# Patient Record
Sex: Male | Born: 1970 | Race: White | Hispanic: No | Marital: Married | Smoking: Former smoker
Health system: Southern US, Community
[De-identification: ages and names within clinical notes are randomized; demographics above are authoritative.]

## PROBLEM LIST (undated history)

## (undated) DIAGNOSIS — I10 Essential (primary) hypertension: Secondary | ICD-10-CM

## (undated) DIAGNOSIS — G4733 Obstructive sleep apnea (adult) (pediatric): Principal | ICD-10-CM

## (undated) DIAGNOSIS — R011 Cardiac murmur, unspecified: Secondary | ICD-10-CM

## (undated) DIAGNOSIS — N486 Induration penis plastica: Secondary | ICD-10-CM

## (undated) DIAGNOSIS — M1990 Unspecified osteoarthritis, unspecified site: Secondary | ICD-10-CM

## (undated) DIAGNOSIS — G4730 Sleep apnea, unspecified: Secondary | ICD-10-CM

## (undated) DIAGNOSIS — J302 Other seasonal allergic rhinitis: Secondary | ICD-10-CM

## (undated) DIAGNOSIS — R0902 Hypoxemia: Secondary | ICD-10-CM

## (undated) HISTORY — DX: Induration penis plastica: N48.6

## (undated) HISTORY — DX: Obstructive sleep apnea (adult) (pediatric): G47.33

## (undated) HISTORY — DX: Cardiac murmur, unspecified: R01.1

## (undated) HISTORY — DX: Unspecified osteoarthritis, unspecified site: M19.90

## (undated) HISTORY — DX: Hypoxemia: R09.02

## (undated) HISTORY — DX: Other seasonal allergic rhinitis: J30.2

## (undated) HISTORY — PX: MULTIPLE TOOTH EXTRACTIONS: SHX2053

## (undated) HISTORY — DX: Sleep apnea, unspecified: G47.30

---

## 2000-08-08 ENCOUNTER — Emergency Department (HOSPITAL_COMMUNITY): Admission: EM | Admit: 2000-08-08 | Discharge: 2000-08-08 | Payer: Self-pay | Admitting: Emergency Medicine

## 2000-08-08 ENCOUNTER — Encounter: Payer: Self-pay | Admitting: Emergency Medicine

## 2000-08-14 ENCOUNTER — Ambulatory Visit (HOSPITAL_COMMUNITY): Admission: RE | Admit: 2000-08-14 | Discharge: 2000-08-14 | Payer: Self-pay | Admitting: Family Medicine

## 2000-08-14 ENCOUNTER — Encounter: Payer: Self-pay | Admitting: Family Medicine

## 2001-05-20 ENCOUNTER — Emergency Department (HOSPITAL_COMMUNITY): Admission: EM | Admit: 2001-05-20 | Discharge: 2001-05-21 | Payer: Self-pay | Admitting: *Deleted

## 2005-06-10 ENCOUNTER — Emergency Department (HOSPITAL_COMMUNITY): Admission: EM | Admit: 2005-06-10 | Discharge: 2005-06-10 | Payer: Self-pay | Admitting: Emergency Medicine

## 2008-05-26 ENCOUNTER — Encounter: Admission: RE | Admit: 2008-05-26 | Discharge: 2008-05-26 | Payer: Self-pay | Admitting: Otolaryngology

## 2013-06-08 ENCOUNTER — Emergency Department (HOSPITAL_COMMUNITY)
Admission: EM | Admit: 2013-06-08 | Discharge: 2013-06-08 | Disposition: A | Discharge status: Home / Self Care | Payer: Managed Care, Other (non HMO) | Source: Home / Self Care

## 2013-06-08 ENCOUNTER — Encounter (HOSPITAL_COMMUNITY): Payer: Self-pay | Admitting: Emergency Medicine

## 2013-06-08 DIAGNOSIS — M7041 Prepatellar bursitis, right knee: Secondary | ICD-10-CM

## 2013-06-08 DIAGNOSIS — M7040 Prepatellar bursitis, unspecified knee: Secondary | ICD-10-CM

## 2013-06-08 HISTORY — DX: Essential (primary) hypertension: I10

## 2013-06-08 MED ORDER — CEPHALEXIN 500 MG PO CAPS: 1000.0000 mg | ORAL_CAPSULE | Freq: Three times a day (TID) | ORAL | Status: DC

## 2013-06-08 MED ORDER — IBUPROFEN 800 MG PO TABS: 800.0000 mg | ORAL_TABLET | Freq: Three times a day (TID) | ORAL | Status: DC

## 2013-06-08 NOTE — ED Notes (Signed)
Pt C/o Rt knee injury that happened when he fell in his attic 5 days ago.  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  Pt states that is knee is red, swollen and hot to the touch.  Pt states that they drew a ring around his knee to see how redness and swelling were progressing, Pt states that the redness has spread down his leg.  Pt states he did not see any puncture wounds.  Pt denies any fever or N/V. Pt is alert and oriented and in no acute distress.  Leilani Able CMA Student

## 2013-06-08 NOTE — ED Provider Notes (Signed)
History    CSN: [id number] Arrival date & time 06/08/13  1501  None    Chief Complaint  Patient presents with  . Knee Injury  . Insect Bite   (Consider location/radiation/quality/duration/timing/severity/associated sxs/prior Treatment) HPI Comments: 42 year old male presents for evaluation of pain, swelling, and redness in the right knee. This began 5 days ago after falling forward onto his knees in his attic. Since then, the right knee has become red and swollen and has it coming off of it. It was  sore but the soreness has started to resolve. The redness and swelling have begun to resolve on the as well. He has been taking ibuprofen as needed, mainly at night. He has increased pain with bending the knee up well past 90 but otherwise there is no pain with activities. He denies fever, chills, swelling in any other joints, or feeling sick at all.  Past Medical History  Diagnosis Date  . Hypertension    History reviewed. No pertinent past surgical history. No family history on file. History  Substance Use Topics  . Smoking status: Never Smoker   . Smokeless tobacco: Not on file  . Alcohol Use: No    Review of Systems  Constitutional: Negative for fever, chills and fatigue.  HENT: Negative for sore throat, neck pain and neck stiffness.   Eyes: Negative for visual disturbance.  Respiratory: Negative for cough and shortness of breath.   Cardiovascular: Negative for chest pain, palpitations and leg swelling.  Gastrointestinal: Negative for nausea, vomiting, abdominal pain, diarrhea and constipation.  Genitourinary: Negative for dysuria, urgency, frequency and hematuria.  Musculoskeletal: Positive for joint swelling and arthralgias. Negative for myalgias.  Skin: Negative for rash.  Neurological: Negative for dizziness, weakness and light-headedness.    Allergies  Review of patient's allergies indicates no known allergies.  Home Medications   Current Outpatient Rx  Name   Route  Sig  Dispense  Refill  . cephALEXin (KEFLEX) 500 MG capsule   Oral   Take 2 capsules (1,000 mg total) by mouth 3 (three) times daily.   42 capsule   0   . ibuprofen (ADVIL,MOTRIN) 800 MG tablet   Oral   Take 1 tablet (800 mg total) by mouth 3 (three) times daily.   90 tablet   0    BP 153/102  Pulse 91  Temp(Src) 98.6 F (37 C)  Resp 20  SpO2 100% Physical Exam  Nursing note and vitals reviewed. Constitutional: He is oriented to person, place, and time. He appears well-developed and well-nourished. No distress.  HENT:  Head: Normocephalic and atraumatic.  Eyes: EOM are normal. Pupils are equal, round, and reactive to light.  Cardiovascular: Normal rate and regular rhythm.  Exam reveals no gallop and no friction rub.   No murmur heard. Pulmonary/Chest: Effort normal and breath sounds normal. No respiratory distress. He has no wheezes. He has no rales.  Abdominal: Soft. There is no tenderness.  Musculoskeletal:       Right knee: He exhibits swelling (Over the inferior patella), effusion (Minimal  effusion) and erythema (Over the inferior anterior and lateral aspects of the knee). He exhibits normal range of motion and normal patellar mobility. Tenderness (over the inferior patellaand just lateral and medial to this) found.  Neurological: He is oriented to person, place, and time.  Skin: Skin is warm and dry. No rash noted.  Psychiatric: He has a normal mood and affect. Judgment normal.    ED Course  Procedures (including critical care time)  Labs Reviewed - No data to display No results found. 1. Prepatellar bursitis, right     MDM  This patient has a prepatellar bursitis. There is low suspicion for a septic bursitis but not a septic joint at this time. He has some pain with extreme bending of the knee but no pain in the knee joint itself and no pain with ambulation. We'll treat with anti-inflammatories, also will cover with Keflex in case there is septic bursitis or  some cellulitis over the knee. Discussed with patient the possibility of developing a septic arthritis if this is in fact cellulitis in the skin overlying the joint. He understands and if the pain/swelling gets worse or he starts to feel sick he will return immediately. For the same concern, I do not want to aspirate the knee joint at this time and potentially introduce infection into the joint   Meds ordered this encounter  Medications  . ibuprofen (ADVIL,MOTRIN) 800 MG tablet    Sig: Take 1 tablet (800 mg total) by mouth 3 (three) times daily.    Dispense:  90 tablet    Refill:  0  . cephALEXin (KEFLEX) 500 MG capsule    Sig: Take 2 capsules (1,000 mg total) by mouth 3 (three) times daily.    Dispense:  42 capsule    Refill:  0     Graylon Good, PA-C 06/08/13 1605

## 2013-06-10 NOTE — ED Provider Notes (Signed)
Medical screening examination/treatment/procedure(s) were performed by a resident physician or non-physician practitioner and as the supervising physician I was immediately available for consultation/collaboration.  Lenaya Pietsch, MD    Phyllis Whitefield S Constantina Laseter, MD 06/10/13 1719 

## 2014-05-20 ENCOUNTER — Encounter: Payer: Self-pay | Admitting: *Deleted

## 2017-01-16 ENCOUNTER — Ambulatory Visit: Payer: Managed Care, Other (non HMO) | Admitting: Primary Care

## 2017-01-21 ENCOUNTER — Ambulatory Visit (INDEPENDENT_AMBULATORY_CARE_PROVIDER_SITE_OTHER): Payer: Managed Care, Other (non HMO) | Admitting: Primary Care

## 2017-01-21 ENCOUNTER — Encounter: Payer: Self-pay | Admitting: Primary Care

## 2017-01-21 ENCOUNTER — Encounter (INDEPENDENT_AMBULATORY_CARE_PROVIDER_SITE_OTHER): Payer: Self-pay

## 2017-01-21 VITALS — BP 150/92 | HR 78 | Temp 97.8°F | Ht 72.0 in | Wt 223.4 lb

## 2017-01-21 DIAGNOSIS — Z0001 Encounter for general adult medical examination with abnormal findings: Secondary | ICD-10-CM | POA: Insufficient documentation

## 2017-01-21 DIAGNOSIS — I10 Essential (primary) hypertension: Secondary | ICD-10-CM | POA: Insufficient documentation

## 2017-01-21 DIAGNOSIS — Z23 Encounter for immunization: Secondary | ICD-10-CM

## 2017-01-21 DIAGNOSIS — Z0000 Encounter for general adult medical examination without abnormal findings: Secondary | ICD-10-CM | POA: Diagnosis not present

## 2017-01-21 LAB — COMPREHENSIVE METABOLIC PANEL
ALT: 16 U/L (ref 0–53)
AST: 16 U/L (ref 0–37)
Albumin: 4.4 g/dL (ref 3.5–5.2)
Alkaline Phosphatase: 57 U/L (ref 39–117)
BUN: 16 mg/dL (ref 6–23)
CO2: 31 mEq/L (ref 19–32)
Calcium: 9.2 mg/dL (ref 8.4–10.5)
Chloride: 101 mEq/L (ref 96–112)
Creatinine, Ser: 1.22 mg/dL (ref 0.40–1.50)
GFR: 68.45 mL/min (ref >60.00)
Glucose, Bld: 96 mg/dL (ref 70–99)
Potassium: 4.4 mEq/L (ref 3.5–5.1)
Sodium: 139 mEq/L (ref 135–145)
Total Bilirubin: 1 mg/dL (ref 0.2–1.2)
Total Protein: 7.5 g/dL (ref 6.0–8.3)

## 2017-01-21 LAB — HEMOGLOBIN A1C: HEMOGLOBIN A1C: 5.6 % (ref 4.6–6.5)

## 2017-01-21 LAB — LIPID PANEL
CHOLESTEROL: 170 mg/dL (ref 0–200)
HDL: 36.2 mg/dL — AB (ref >39.00)
LDL Cholesterol: 104 mg/dL — ABNORMAL HIGH (ref 0–99)
NonHDL: 133.6
Total CHOL/HDL Ratio: 5
Triglycerides: 147 mg/dL (ref 0.0–149.0)
VLDL: 29.4 mg/dL (ref 0.0–40.0)

## 2017-01-21 MED ORDER — LISINOPRIL 20 MG PO TABS: 20.0000 mg | ORAL_TABLET | Freq: Every day | ORAL | 1 refills | Status: DC

## 2017-01-21 NOTE — Addendum Note (Signed)
Addended by: Jacqualin Combes on: 01/21/2017 05:09 PM   Modules accepted: Orders

## 2017-01-21 NOTE — Progress Notes (Signed)
Pre visit review using our clinic review tool, if applicable. No additional management support is needed unless otherwise documented below in the visit note. 

## 2017-01-21 NOTE — Progress Notes (Signed)
Subjective:    Patient ID: Edward Mccarty, male    DOB: 1971-09-16, 46 y.o.   MRN: [id number]  HPI  Mr. Bishara is a 46 year old male who presents today to establish care, for complete physical, and discuss the problems mentioned below. Will obtain old records.  1) Essential Hypertension: Diagnosed years ago and was once managed on medication in the past but didn't follow up with his PCP so this medication was not refilled. He does not remember the name of the medication for which he was taking. His BP in the office today is 150/92. He checks his BP sporidially at home with readings of 140-150's/90-100's. Denies chest pain, dizziness, shortness of breath.   Immunizations: -Tetanus: Unsure, believes it's been over 10 years.  -Influenza: Did not complete this season.   Diet: He endorses a fair diet. Breakfast: Skips Lunch: Fast food, salad with protein, lean protein, rice Dinner: Fast food, salads, lean protein, starch. Snacks: Occasional yogurt Desserts: Occasionally Beverages: Water, diet soda  Exercise: Works out at [employer] 5 days weekly Eye exam: Completed in 2017 Dental exam: Does not complete. Dentures.    Review of Systems  Constitutional: Negative for unexpected weight change.  HENT: Negative for rhinorrhea.   Respiratory: Negative for cough and shortness of breath.   Cardiovascular: Negative for chest pain.  Gastrointestinal: Negative for constipation and diarrhea.  Genitourinary: Negative for difficulty urinating.  Musculoskeletal: Negative for arthralgias and myalgias.  Skin: Negative for rash.  Allergic/Immunologic: Negative for environmental allergies.  Neurological: Negative for dizziness, numbness and headaches.  Psychiatric/Behavioral:       Denies concerns for anxiety or depression       Past Medical History:  Diagnosis Date  . Arthritis   . Heart murmur   . Hypertension   . Peyronie's disease      Social History   Social History  . Marital  status: Married    Spouse name: N/A  . Number of children: N/A  . Years of education: N/A   Occupational History  . Not on file.   Social History Main Topics  . Smoking status: Former Smoker    Quit date: 05/21/1991  . Smokeless tobacco: Never Used  . Alcohol use No  . Drug use: No  . Sexual activity: Not on file   Other Topics Concern  . Not on file   Social History Narrative   Married.   3 children.   Works as an Radio broadcast assistant.   Enjoys working out, sleeping, relaxing.     No past surgical history on file.  Family History  Problem Relation Age of Onset  . Hypertension Mother   . Stroke Mother   . Heart disease Mother   . Hypertension Father   . Stroke Father   . Diabetes Father   . Heart disease Father   . Diabetes Brother   . Lung cancer Paternal Grandmother     No Known Allergies  No current outpatient prescriptions on file prior to visit.   No current facility-administered medications on file prior to visit.     BP (!) 150/92   Pulse 78   Temp 97.8 F (36.6 C) (Oral)   Ht 6' (1.829 m)   Wt 223 lb 6.4 oz (101.3 kg)   SpO2 97%   BMI 30.30 kg/m    Objective:   Physical Exam  Constitutional: He is oriented to person, place, and time. He appears well-nourished.  HENT:  Right Ear: Tympanic membrane and  ear canal normal.  Left Ear: Tympanic membrane and ear canal normal.  Nose: Nose normal. Right sinus exhibits no maxillary sinus tenderness and no frontal sinus tenderness. Left sinus exhibits no maxillary sinus tenderness and no frontal sinus tenderness.  Mouth/Throat: Oropharynx is clear and moist.  Eyes: Conjunctivae and EOM are normal. Pupils are equal, round, and reactive to light.  Neck: Neck supple. Carotid bruit is not present. No thyromegaly present.  Cardiovascular: Normal rate, regular rhythm and normal heart sounds.   Pulmonary/Chest: Effort normal and breath sounds normal. He has no wheezes. He has no rales.  Abdominal: Soft. Bowel  sounds are normal. There is no tenderness.  Musculoskeletal: Normal range of motion.  Neurological: He is alert and oriented to person, place, and time. He has normal reflexes. No cranial nerve deficit.  Skin: Skin is warm and dry.  Psychiatric: He has a normal mood and affect.          Assessment & Plan:

## 2017-01-21 NOTE — Assessment & Plan Note (Signed)
Td due, provided today.  Discussed the importance of a healthy diet and regular exercise in order for weight loss, and to reduce the risk of other medical diseases. Exam unremarkable. Labs pending. Follow up in 1 year for annual exam.

## 2017-01-21 NOTE — Patient Instructions (Addendum)
Complete lab work prior to leaving today. I will notify you of your results once received.   Start Lisinopril 20 mg. Take 1 tablet by mouth every morning.  Check your blood pressure daily, around the same time of day, for the next 3 weeks.  Ensure that you have rested for 30 minutes prior to checking your blood pressure. Record your readings and bring them to your next visit.  Continue exercising. You should be getting 150 minutes of moderate intensity exercise weekly.  Continue your efforts towards a healthy diet. Increase vegetables, whole grains, fruit.  Schedule a follow up visit in 3 weeks for re-evaluation of your blood pressure.  It was a pleasure to meet you today! Please don't hesitate to call me with any questions. Welcome to [facility]!   Hypertension Hypertension, commonly called high blood pressure, is when the force of blood pumping through your arteries is too strong. Your arteries are the blood vessels that carry blood from your heart throughout your body. A blood pressure reading consists of a higher number over a lower number, such as 110/72. The higher number (systolic) is the pressure inside your arteries when your heart pumps. The lower number (diastolic) is the pressure inside your arteries when your heart relaxes. Ideally you want your blood pressure below 120/80. Hypertension forces your heart to work harder to pump blood. Your arteries may become narrow or stiff. Having untreated or uncontrolled hypertension can cause heart attack, stroke, kidney disease, and other problems. What increases the risk? Some risk factors for high blood pressure are controllable. Others are not. Risk factors you cannot control include:  Race. You may be at higher risk if you are African American.  Age. Risk increases with age.  Gender. Men are at higher risk than women before age 63 years. After age 32, women are at higher risk than men. Risk factors you can control include:  Not  getting enough exercise or physical activity.  Being overweight.  Getting too much fat, sugar, calories, or salt in your diet.  Drinking too much alcohol. What are the signs or symptoms? Hypertension does not usually cause signs or symptoms. Extremely high blood pressure (hypertensive crisis) may cause headache, anxiety, shortness of breath, and nosebleed. How is this diagnosed? To check if you have hypertension, your health care provider will measure your blood pressure while you are seated, with your arm held at the level of your heart. It should be measured at least twice using the same arm. Certain conditions can cause a difference in blood pressure between your right and left arms. A blood pressure reading that is higher than normal on one occasion does not mean that you need treatment. If it is not clear whether you have high blood pressure, you may be asked to return on a different day to have your blood pressure checked again. Or, you may be asked to monitor your blood pressure at home for 1 or more weeks. How is this treated? Treating high blood pressure includes making lifestyle changes and possibly taking medicine. Living a healthy lifestyle can help lower high blood pressure. You may need to change some of your habits. Lifestyle changes may include:  Following the DASH diet. This diet is high in fruits, vegetables, and whole grains. It is low in salt, red meat, and added sugars.  Keep your sodium intake below 2,300 mg per day.  Getting at least 30-45 minutes of aerobic exercise at least 4 times per week.  Losing weight if necessary.  Not smoking.  Limiting alcoholic beverages.  Learning ways to reduce stress. Your health care provider may prescribe medicine if lifestyle changes are not enough to get your blood pressure under control, and if one of the following is true:  You are 70-84 years of age and your systolic blood pressure is above 140.  You are 32 years of age or  older, and your systolic blood pressure is above 150.  Your diastolic blood pressure is above 90.  You have diabetes, and your systolic blood pressure is over [id number] or your diastolic blood pressure is over 90.  You have kidney disease and your blood pressure is above 140/90.  You have heart disease and your blood pressure is above 140/90. Your personal target blood pressure may vary depending on your medical conditions, your age, and other factors. Follow these instructions at home:  Have your blood pressure rechecked as directed by your health care provider.  Take medicines only as directed by your health care provider. Follow the directions carefully. Blood pressure medicines must be taken as prescribed. The medicine does not work as well when you skip doses. Skipping doses also puts you at risk for problems.  Do not smoke.  Monitor your blood pressure at home as directed by your health care provider. Contact a health care provider if:  You think you are having a reaction to medicines taken.  You have recurrent headaches or feel dizzy.  You have swelling in your ankles.  You have trouble with your vision. Get help right away if:  You develop a severe headache or confusion.  You have unusual weakness, numbness, or feel faint.  You have severe chest or abdominal pain.  You vomit repeatedly.  You have trouble breathing. This information is not intended to replace advice given to you by your health care provider. Make sure you discuss any questions you have with your health care provider. Document Released: 11/12/2005 Document Revised: 04/19/2016 Document Reviewed: 09/04/2013 Elsevier Interactive Patient Education  2017 [employer].

## 2017-01-21 NOTE — Assessment & Plan Note (Signed)
History of HTN for years, no recent medication management. BP above goal today, also on home readings. Start Lisinopril 20 mg daily. Continue to work on diet and exercise. Follow up in 3 weeks with home BP readings.

## 2017-02-04 ENCOUNTER — Ambulatory Visit: Payer: Managed Care, Other (non HMO) | Admitting: Primary Care

## 2017-02-11 ENCOUNTER — Encounter: Payer: Self-pay | Admitting: Primary Care

## 2017-02-11 ENCOUNTER — Ambulatory Visit (INDEPENDENT_AMBULATORY_CARE_PROVIDER_SITE_OTHER): Payer: Managed Care, Other (non HMO) | Admitting: Primary Care

## 2017-02-11 VITALS — BP 130/82 | HR 80 | Temp 97.5°F | Ht 72.0 in | Wt 230.8 lb

## 2017-02-11 DIAGNOSIS — I10 Essential (primary) hypertension: Secondary | ICD-10-CM

## 2017-02-11 NOTE — Patient Instructions (Addendum)
Continue lisinopril 20 mg tablets for high blood pressure.  Monitor your blood pressure as discussed and notify me of readings at or above 135/90 on a regular basis.  Complete lab work prior to leaving today. I will notify you of your results once received.   Follow up in 1 year for your annual exam or sooner if needed.  It was a pleasure to see you today!   DASH Eating Plan DASH stands for "Dietary Approaches to Stop Hypertension." The DASH eating plan is a healthy eating plan that has been shown to reduce high blood pressure (hypertension). It may also reduce your risk for type 2 diabetes, heart disease, and stroke. The DASH eating plan may also help with weight loss. What are tips for following this plan? General guidelines   Avoid eating more than 2,300 mg (milligrams) of salt (sodium) a day. If you have hypertension, you may need to reduce your sodium intake to 1,500 mg a day.  Limit alcohol intake to no more than 1 drink a day for nonpregnant women and 2 drinks a day for men. One drink equals 12 oz of beer, 5 oz of wine, or 1 oz of hard liquor.  Work with your health care provider to maintain a healthy body weight or to lose weight. Ask what an ideal weight is for you.  Get at least 30 minutes of exercise that causes your heart to beat faster (aerobic exercise) most days of the week. Activities may include walking, swimming, or biking.  Work with your health care provider or diet and nutrition specialist (dietitian) to adjust your eating plan to your individual calorie needs. Reading food labels   Check food labels for the amount of sodium per serving. Choose foods with less than 5 percent of the Daily Value of sodium. Generally, foods with less than 300 mg of sodium per serving fit into this eating plan.  To find whole grains, look for the word "whole" as the first word in the ingredient list. Shopping   Buy products labeled as "low-sodium" or "no salt added."  Buy fresh  foods. Avoid canned foods and premade or frozen meals. Cooking   Avoid adding salt when cooking. Use salt-free seasonings or herbs instead of table salt or sea salt. Check with your health care provider or pharmacist before using salt substitutes.  Do not fry foods. Cook foods using healthy methods such as baking, boiling, grilling, and broiling instead.  Cook with heart-healthy oils, such as olive, canola, soybean, or sunflower oil. Meal planning    Eat a balanced diet that includes:  5 or more servings of fruits and vegetables each day. At each meal, try to fill half of your plate with fruits and vegetables.  Up to 6-8 servings of whole grains each day.  Less than 6 oz of lean meat, poultry, or fish each day. A 3-oz serving of meat is about the same size as a deck of cards. One egg equals 1 oz.  2 servings of low-fat dairy each day.  A serving of nuts, seeds, or beans 5 times each week.  Heart-healthy fats. Healthy fats called Omega-3 fatty acids are found in foods such as flaxseeds and coldwater fish, like sardines, salmon, and mackerel.  Limit how much you eat of the following:  Canned or prepackaged foods.  Food that is high in trans fat, such as fried foods.  Food that is high in saturated fat, such as fatty meat.  Sweets, desserts, sugary drinks, and other  foods with added sugar.  Full-fat dairy products.  Do not salt foods before eating.  Try to eat at least 2 vegetarian meals each week.  Eat more home-cooked food and less restaurant, buffet, and fast food.  When eating at a restaurant, ask that your food be prepared with less salt or no salt, if possible. What foods are recommended? The items listed may not be a complete list. Talk with your dietitian about what dietary choices are best for you. Grains  Whole-grain or whole-wheat bread. Whole-grain or whole-wheat pasta. Brown rice. [place] Morrow. Bulgur. Whole-grain and low-sodium cereals. Pita bread.  Low-fat, low-sodium crackers. Whole-wheat flour tortillas. Vegetables  Fresh or frozen vegetables (raw, steamed, roasted, or grilled). Low-sodium or reduced-sodium tomato and vegetable juice. Low-sodium or reduced-sodium tomato sauce and tomato paste. Low-sodium or reduced-sodium canned vegetables. Fruits  All fresh, dried, or frozen fruit. Canned fruit in natural juice (without added sugar). Meat and other protein foods  Skinless chicken or [place]. Ground chicken or [place]. Pork with fat trimmed off. Fish and seafood. Egg whites. Dried beans, peas, or lentils. Unsalted nuts, nut butters, and seeds. Unsalted canned beans. Lean cuts of beef with fat trimmed off. Low-sodium, lean deli meat. Dairy  Low-fat (1%) or fat-free (skim) milk. Fat-free, low-fat, or reduced-fat cheeses. Nonfat, low-sodium ricotta or cottage cheese. Low-fat or nonfat yogurt. Low-fat, low-sodium cheese. Fats and oils  Soft margarine without trans fats. Vegetable oil. Low-fat, reduced-fat, or light mayonnaise and salad dressings (reduced-sodium). Canola, safflower, olive, soybean, and sunflower oils. Avocado. Seasoning and other foods  Herbs. Spices. Seasoning mixes without salt. Unsalted popcorn and pretzels. Fat-free sweets. What foods are not recommended? The items listed may not be a complete list. Talk with your dietitian about what dietary choices are best for you. Grains  Baked goods made with fat, such as croissants, muffins, or some breads. Dry pasta or rice meal packs. Vegetables  Creamed or fried vegetables. Vegetables in a cheese sauce. Regular canned vegetables (not low-sodium or reduced-sodium). Regular canned tomato sauce and paste (not low-sodium or reduced-sodium). Regular tomato and vegetable juice (not low-sodium or reduced-sodium). Angie Fava. Olives. Fruits  Canned fruit in a light or heavy syrup. Fried fruit. Fruit in cream or butter sauce. Meat and other protein foods  Fatty cuts of meat. Ribs. Fried meat.  Berniece Salines. Sausage. Bologna and other processed lunch meats. Salami. Fatback. Hotdogs. Bratwurst. Salted nuts and seeds. Canned beans with added salt. Canned or smoked fish. Whole eggs or egg yolks. Chicken or [place] with skin. Dairy  Whole or 2% milk, cream, and half-and-half. Whole or full-fat cream cheese. Whole-fat or sweetened yogurt. Full-fat cheese. Nondairy creamers. Whipped toppings. Processed cheese and cheese spreads. Fats and oils  Butter. Stick margarine. Lard. Shortening. Ghee. Bacon fat. Tropical oils, such as coconut, palm kernel, or palm oil. Seasoning and other foods  Salted popcorn and pretzels. Onion salt, garlic salt, seasoned salt, table salt, and sea salt. Worcestershire sauce. Tartar sauce. Barbecue sauce. Teriyaki sauce. Soy sauce, including reduced-sodium. Steak sauce. Canned and packaged gravies. Fish sauce. Oyster sauce. Cocktail sauce. Horseradish that you find on the shelf. Ketchup. Mustard. Meat flavorings and tenderizers. Bouillon cubes. Hot sauce and Tabasco sauce. Premade or packaged marinades. Premade or packaged taco seasonings. Relishes. Regular salad dressings. Where to find more information:  National Heart, Lung, and [place]: https://wilson-eaton.com/  American Heart Association: www.heart.org Summary  The DASH eating plan is a healthy eating plan that has been shown to reduce high blood pressure (hypertension). It may  also reduce your risk for type 2 diabetes, heart disease, and stroke.  With the DASH eating plan, you should limit salt (sodium) intake to 2,300 mg a day. If you have hypertension, you may need to reduce your sodium intake to 1,500 mg a day.  When on the DASH eating plan, aim to eat more fresh fruits and vegetables, whole grains, lean proteins, low-fat dairy, and heart-healthy fats.  Work with your health care provider or diet and nutrition specialist (dietitian) to adjust your eating plan to your individual calorie needs. This information is  not intended to replace advice given to you by your health care provider. Make sure you discuss any questions you have with your health care provider. Document Released: 11/01/2011 Document Revised: 11/05/2016 Document Reviewed: 11/05/2016 Elsevier Interactive Patient Education  2017 [employer].

## 2017-02-11 NOTE — Progress Notes (Signed)
   Subjective:    Patient ID: Edward Mccarty, male    DOB: 1971-03-15, 46 y.o.   MRN: [id number]  HPI  Edward Mccarty is a 46 year old male who presents today for follow up of hypertension. Prior history of hypertension. Presented as a new patient 3 weeks ago with BP of 150/92. He had been monitoring his BP readings at home which were above goal. He was initiated on lisinopril 20 mg during his last visit given numerous elevated readings.  His BP in the office today is 130/82. He's been checking his readings at home and is getting readings of 120's-130's/70's-80's. He did notice a mild cough when he first started taking the medication which is not bothersome overall. No cough in the past 3-4 days. He denies chest pain, dizziness, visual changes.   Review of Systems  Constitutional: Negative for fatigue.  Respiratory: Negative for shortness of breath.   Cardiovascular: Negative for chest pain.  Neurological: Negative for dizziness and headaches.       Past Medical History:  Diagnosis Date  . Arthritis   . Heart murmur   . Hypertension   . Peyronie's disease      Social History   Social History  . Marital status: Married    Spouse name: N/A  . Number of children: N/A  . Years of education: N/A   Occupational History  . Not on file.   Social History Main Topics  . Smoking status: Former Smoker    Quit date: 05/21/1991  . Smokeless tobacco: Never Used  . Alcohol use No  . Drug use: No  . Sexual activity: Not on file   Other Topics Concern  . Not on file   Social History Narrative   Married.   3 children.   Works as an Radio broadcast assistant.   Enjoys working out, sleeping, relaxing.     No past surgical history on file.  Family History  Problem Relation Age of Onset  . Hypertension Mother   . Stroke Mother   . Heart disease Mother   . Hypertension Father   . Stroke Father   . Diabetes Father   . Heart disease Father   . Diabetes Brother   . Lung cancer Paternal  Grandmother     No Known Allergies  Current Outpatient Prescriptions on File Prior to Visit  Medication Sig Dispense Refill  . lisinopril (PRINIVIL,ZESTRIL) 20 MG tablet Take 1 tablet (20 mg total) by mouth daily. 30 tablet 1   No current facility-administered medications on file prior to visit.     BP 130/82   Pulse 80   Temp 97.5 F (36.4 C) (Oral)   Ht 6' (1.829 m)   Wt 230 lb 12.8 oz (104.7 kg)   SpO2 98%   BMI 31.30 kg/m    Objective:   Physical Exam  Constitutional: He appears well-nourished.  Neck: Neck supple.  Cardiovascular: Normal rate and regular rhythm.   Pulmonary/Chest: Effort normal and breath sounds normal.  Skin: Skin is warm and dry.          Assessment & Plan:

## 2017-02-11 NOTE — Assessment & Plan Note (Signed)
Improved on lisinopril 20 mg, continue same. BMP pending. He will notify if cough returns or becomes bothersome.

## 2017-02-11 NOTE — Progress Notes (Signed)
Pre visit review using our clinic review tool, if applicable. No additional management support is needed unless otherwise documented below in the visit note. 

## 2017-02-12 LAB — BASIC METABOLIC PANEL
BUN: 15 mg/dL (ref 6–23)
CALCIUM: 9.5 mg/dL (ref 8.4–10.5)
CO2: 30 mEq/L (ref 19–32)
Chloride: 100 mEq/L (ref 96–112)
Creatinine, Ser: 1.29 mg/dL (ref 0.40–1.50)
GFR: 64.16 mL/min (ref >60.00)
Glucose, Bld: 127 mg/dL — ABNORMAL HIGH (ref 70–99)
Potassium: 3.9 mEq/L (ref 3.5–5.1)
Sodium: 138 mEq/L (ref 135–145)

## 2017-02-13 ENCOUNTER — Encounter: Payer: Self-pay | Admitting: *Deleted

## 2017-03-19 ENCOUNTER — Other Ambulatory Visit: Payer: Self-pay | Admitting: Primary Care

## 2017-03-19 DIAGNOSIS — I10 Essential (primary) hypertension: Secondary | ICD-10-CM

## 2017-09-27 ENCOUNTER — Other Ambulatory Visit: Payer: Self-pay | Admitting: Primary Care

## 2017-09-27 DIAGNOSIS — I10 Essential (primary) hypertension: Secondary | ICD-10-CM

## 2018-01-22 ENCOUNTER — Ambulatory Visit (INDEPENDENT_AMBULATORY_CARE_PROVIDER_SITE_OTHER): Payer: Managed Care, Other (non HMO) | Admitting: Primary Care

## 2018-01-22 ENCOUNTER — Encounter: Payer: Self-pay | Admitting: Primary Care

## 2018-01-22 VITALS — BP 118/70 | HR 96 | Temp 98.2°F | Ht 72.5 in | Wt 232.0 lb

## 2018-01-22 DIAGNOSIS — J309 Allergic rhinitis, unspecified: Secondary | ICD-10-CM

## 2018-01-22 DIAGNOSIS — R0683 Snoring: Secondary | ICD-10-CM | POA: Diagnosis not present

## 2018-01-22 DIAGNOSIS — I10 Essential (primary) hypertension: Secondary | ICD-10-CM

## 2018-01-22 DIAGNOSIS — Z0000 Encounter for general adult medical examination without abnormal findings: Secondary | ICD-10-CM | POA: Diagnosis not present

## 2018-01-22 LAB — COMPREHENSIVE METABOLIC PANEL
ALBUMIN: 4.2 g/dL (ref 3.5–5.2)
ALK PHOS: 54 U/L (ref 39–117)
ALT: 18 U/L (ref 0–53)
AST: 17 U/L (ref 0–37)
BILIRUBIN TOTAL: 1.1 mg/dL (ref 0.2–1.2)
BUN: 16 mg/dL (ref 6–23)
CALCIUM: 9.6 mg/dL (ref 8.4–10.5)
CO2: 31 mEq/L (ref 19–32)
Chloride: 101 mEq/L (ref 96–112)
Creatinine, Ser: 1.3 mg/dL (ref 0.40–1.50)
GFR: 63.32 mL/min (ref >60.00)
Glucose, Bld: 114 mg/dL — ABNORMAL HIGH (ref 70–99)
POTASSIUM: 4.2 meq/L (ref 3.5–5.1)
Sodium: 137 mEq/L (ref 135–145)
Total Protein: 7.5 g/dL (ref 6.0–8.3)

## 2018-01-22 LAB — LIPID PANEL
CHOL/HDL RATIO: 5
CHOLESTEROL: 202 mg/dL — AB (ref 0–200)
HDL: 40.3 mg/dL (ref >39.00)
LDL Cholesterol: 127 mg/dL — ABNORMAL HIGH (ref 0–99)
NonHDL: 161.36
TRIGLYCERIDES: 171 mg/dL — AB (ref 0.0–149.0)
VLDL: 34.2 mg/dL (ref 0.0–40.0)

## 2018-01-22 MED ORDER — LORATADINE 10 MG PO TABS: 10.0000 mg | ORAL_TABLET | Freq: Every day | ORAL | 0 refills | Status: DC

## 2018-01-22 MED ORDER — FLUTICASONE PROPIONATE 50 MCG/ACT NA SUSP: 1.0000 | Freq: Two times a day (BID) | NASAL | 1 refills | Status: DC | PRN

## 2018-01-22 NOTE — Assessment & Plan Note (Signed)
Td UTD, declines influenza.  Discussed importance of a healthy diet. Increase vegetables, fruit, whole grains, water. Commended him on regular exercise. Exam unremarkable. Referral placed to pulmonology for further evaluation of snoring. Labs pending.

## 2018-01-22 NOTE — Assessment & Plan Note (Signed)
Seasonal. Will trial Flonase and Claritin daily as needed. Rx's sent to pharmacy.

## 2018-01-22 NOTE — Assessment & Plan Note (Signed)
Reported snoring and periods of apnea per his roommates. Epworth Sleepiness scale of 8 today. Does have a large [place]. Referral placed to pulmonology for further evaluation.

## 2018-01-22 NOTE — Patient Instructions (Signed)
Nasal Congestion/Ear Pressure: Try using Flonase (fluticasone) nasal spray. Instill 1 spray in each nostril twice daily.   You can use Claritin once daily for allergies.  Continue exercising. You should be getting 150 minutes of moderate intensity exercise weekly.  Ensure you are consuming 64 ounces of water daily.  Increase vegetables, fruit, whole grains, lean protein.  Stop by the lab prior to leaving today. I will notify you of your results once received.   Schedule an eye exam.   You will be contacted regarding your referral to Pulmonology for the sleep study.  Please let us know if you have not been contacted within one week.   Follow up in 1 year for your annual exam or sooner if needed.  It was a pleasure to see you today!   DASH Eating Plan DASH stands for "Dietary Approaches to Stop Hypertension." The DASH eating plan is a healthy eating plan that has been shown to reduce high blood pressure (hypertension). It may also reduce your risk for type 2 diabetes, heart disease, and stroke. The DASH eating plan may also help with weight loss. What are tips for following this plan? General guidelines  Avoid eating more than 2,300 mg (milligrams) of salt (sodium) a day. If you have hypertension, you may need to reduce your sodium intake to 1,500 mg a day.  Limit alcohol intake to no more than 1 drink a day for nonpregnant women and 2 drinks a day for men. One drink equals 12 oz of beer, 5 oz of wine, or 1 oz of hard liquor.  Work with your health care provider to maintain a healthy body weight or to lose weight. Ask what an ideal weight is for you.  Get at least 30 minutes of exercise that causes your heart to beat faster (aerobic exercise) most days of the week. Activities may include walking, swimming, or biking.  Work with your health care provider or diet and nutrition specialist (dietitian) to adjust your eating plan to your individual calorie needs. Reading food  labels  Check food labels for the amount of sodium per serving. Choose foods with less than 5 percent of the Daily Value of sodium. Generally, foods with less than 300 mg of sodium per serving fit into this eating plan.  To find whole grains, look for the word "whole" as the first word in the ingredient list. Shopping  Buy products labeled as "low-sodium" or "no salt added."  Buy fresh foods. Avoid canned foods and premade or frozen meals. Cooking  Avoid adding salt when cooking. Use salt-free seasonings or herbs instead of table salt or sea salt. Check with your health care provider or pharmacist before using salt substitutes.  Do not fry foods. Cook foods using healthy methods such as baking, boiling, grilling, and broiling instead.  Cook with heart-healthy oils, such as olive, canola, soybean, or sunflower oil. Meal planning   Eat a balanced diet that includes: ? 5 or more servings of fruits and vegetables each day. At each meal, try to fill half of your plate with fruits and vegetables. ? Up to 6-8 servings of whole grains each day. ? Less than 6 oz of lean meat, poultry, or fish each day. A 3-oz serving of meat is about the same size as a deck of cards. One egg equals 1 oz. ? 2 servings of low-fat dairy each day. ? A serving of nuts, seeds, or beans 5 times each week. ? Heart-healthy fats. Healthy fats called Omega-3 fatty  acids are found in foods such as flaxseeds and coldwater fish, like sardines, salmon, and mackerel.  Limit how much you eat of the following: ? Canned or prepackaged foods. ? Food that is high in trans fat, such as fried foods. ? Food that is high in saturated fat, such as fatty meat. ? Sweets, desserts, sugary drinks, and other foods with added sugar. ? Full-fat dairy products.  Do not salt foods before eating.  Try to eat at least 2 vegetarian meals each week.  Eat more home-cooked food and less restaurant, buffet, and fast food.  When eating at a  restaurant, ask that your food be prepared with less salt or no salt, if possible. What foods are recommended? The items listed may not be a complete list. Talk with your dietitian about what dietary choices are best for you. Grains Whole-grain or whole-wheat bread. Whole-grain or whole-wheat pasta. Brown rice. [place]. Bulgur. Whole-grain and low-sodium cereals. Pita bread. Low-fat, low-sodium crackers. Whole-wheat flour tortillas. Vegetables Fresh or frozen vegetables (raw, steamed, roasted, or grilled). Low-sodium or reduced-sodium tomato and vegetable juice. Low-sodium or reduced-sodium tomato sauce and tomato paste. Low-sodium or reduced-sodium canned vegetables. Fruits All fresh, dried, or frozen fruit. Canned fruit in natural juice (without added sugar). Meat and other protein foods Skinless chicken or [place]. Ground chicken or [place]. Pork with fat trimmed off. Fish and seafood. Egg whites. Dried beans, peas, or lentils. Unsalted nuts, nut butters, and seeds. Unsalted canned beans. Lean cuts of beef with fat trimmed off. Low-sodium, lean deli meat. Dairy Low-fat (1%) or fat-free (skim) milk. Fat-free, low-fat, or reduced-fat cheeses. Nonfat, low-sodium ricotta or cottage cheese. Low-fat or nonfat yogurt. Low-fat, low-sodium cheese. Fats and oils Soft margarine without trans fats. Vegetable oil. Low-fat, reduced-fat, or light mayonnaise and salad dressings (reduced-sodium). Canola, safflower, olive, soybean, and sunflower oils. Avocado. Seasoning and other foods Herbs. Spices. Seasoning mixes without salt. Unsalted popcorn and pretzels. Fat-free sweets. What foods are not recommended? The items listed may not be a complete list. Talk with your dietitian about what dietary choices are best for you. Grains Baked goods made with fat, such as croissants, muffins, or some breads. Dry pasta or rice meal packs. Vegetables Creamed or fried vegetables. Vegetables in a cheese sauce.  Regular canned vegetables (not low-sodium or reduced-sodium). Regular canned tomato sauce and paste (not low-sodium or reduced-sodium). Regular tomato and vegetable juice (not low-sodium or reduced-sodium). Angie Fava. Olives. Fruits Canned fruit in a light or heavy syrup. Fried fruit. Fruit in cream or butter sauce. Meat and other protein foods Fatty cuts of meat. Ribs. Fried meat. Berniece Salines. Sausage. Bologna and other processed lunch meats. Salami. Fatback. Hotdogs. Bratwurst. Salted nuts and seeds. Canned beans with added salt. Canned or smoked fish. Whole eggs or egg yolks. Chicken or [place] with skin. Dairy Whole or 2% milk, cream, and half-and-half. Whole or full-fat cream cheese. Whole-fat or sweetened yogurt. Full-fat cheese. Nondairy creamers. Whipped toppings. Processed cheese and cheese spreads. Fats and oils Butter. Stick margarine. Lard. Shortening. Ghee. Bacon fat. Tropical oils, such as coconut, palm kernel, or palm oil. Seasoning and other foods Salted popcorn and pretzels. Onion salt, garlic salt, seasoned salt, table salt, and sea salt. Worcestershire sauce. Tartar sauce. Barbecue sauce. Teriyaki sauce. Soy sauce, including reduced-sodium. Steak sauce. Canned and packaged gravies. Fish sauce. Oyster sauce. Cocktail sauce. Horseradish that you find on the shelf. Ketchup. Mustard. Meat flavorings and tenderizers. Bouillon cubes. Hot sauce and Tabasco sauce. Premade or packaged marinades. Premade or packaged  taco seasonings. Relishes. Regular salad dressings. Where to find more information:  National Heart, Lung, and [place]: https://wilson-eaton.com/  American Heart Association: www.heart.org Summary  The DASH eating plan is a healthy eating plan that has been shown to reduce high blood pressure (hypertension). It may also reduce your risk for type 2 diabetes, heart disease, and stroke.  With the DASH eating plan, you should limit salt (sodium) intake to 2,300 mg a day. If you have  hypertension, you may need to reduce your sodium intake to 1,500 mg a day.  When on the DASH eating plan, aim to eat more fresh fruits and vegetables, whole grains, lean proteins, low-fat dairy, and heart-healthy fats.  Work with your health care provider or diet and nutrition specialist (dietitian) to adjust your eating plan to your individual calorie needs. This information is not intended to replace advice given to you by your health care provider. Make sure you discuss any questions you have with your health care provider. Document Released: 11/01/2011 Document Revised: 11/05/2016 Document Reviewed: 11/05/2016 Elsevier Interactive Patient Education  [employer].

## 2018-01-22 NOTE — Assessment & Plan Note (Signed)
Stable in the office today, continue lisinopril 20 mg. CMP pending.  

## 2018-01-22 NOTE — Progress Notes (Signed)
Subjective:    Patient ID: Edward Mccarty, male    DOB: 1971/04/11, 47 y.o.   MRN: [id number]  HPI  Edward Mccarty is a 47 year old male who presents today for complete physical.  Immunizations: -Tetanus: Completed in 2018 -Influenza: Did not complete this season    Diet: He endorses a poor diet.  Breakfast: Skips Lunch: Fast food (salads sometimes, mostly burgers and fries) Dinner: Skips, sometimes grilled chicken, rice, salads Snacks: None Desserts: None Beverages: Soda, sweet tea, no water  Exercise: He exercises 5 days weekly at the gym one hour at a time Eye exam: Completed 1-2 years ago Dental exam: No recent exam, dentures.      Review of Systems  Constitutional: Negative for unexpected weight change.  HENT: Positive for congestion. Negative for rhinorrhea.   Respiratory: Negative for cough and shortness of breath.        Snoring, periods of apnea during sleep. Symptoms present for years.   Cardiovascular: Negative for chest pain.  Gastrointestinal: Negative for constipation and diarrhea.  Genitourinary: Negative for difficulty urinating.  Musculoskeletal: Negative for arthralgias and myalgias.  Skin: Negative for rash.  Allergic/Immunologic: Positive for environmental allergies.  Neurological: Negative for dizziness, numbness and headaches.  Psychiatric/Behavioral: The patient is not nervous/anxious.        Past Medical History:  Diagnosis Date  . Arthritis   . Heart murmur   . Hypertension   . Peyronie's disease      Social History   Socioeconomic History  . Marital status: Married    Spouse name: Not on file  . Number of children: Not on file  . Years of education: Not on file  . Highest education level: Not on file  Social Needs  . Financial resource strain: Not on file  . Food insecurity - worry: Not on file  . Food insecurity - inability: Not on file  . Transportation needs - medical: Not on file  . Transportation needs - non-medical: Not  on file  Occupational History  . Not on file  Tobacco Use  . Smoking status: Former Smoker    Last attempt to quit: 05/21/1991    Years since quitting: 26.6  . Smokeless tobacco: Never Used  Substance and Sexual Activity  . Alcohol use: No  . Drug use: No  . Sexual activity: Not on file  Other Topics Concern  . Not on file  Social History Narrative   Married.   3 children.   Works as an Radio broadcast assistant.   Enjoys working out, sleeping, relaxing.     No past surgical history on file.  Family History  Problem Relation Age of Onset  . Hypertension Mother   . Stroke Mother   . Heart disease Mother   . Hypertension Father   . Stroke Father   . Diabetes Father   . Heart disease Father   . Diabetes Brother   . Lung cancer Paternal Grandmother     No Known Allergies  Current Outpatient Medications on File Prior to Visit  Medication Sig Dispense Refill  . lisinopril (PRINIVIL,ZESTRIL) 20 MG tablet TAKE 1 TABLET BY MOUTH EVERY DAY 30 tablet 5   No current facility-administered medications on file prior to visit.     BP 118/70 (BP Location: Right Arm, Patient Position: Sitting, Cuff Size: Large)   Pulse 96   Temp 98.2 F (36.8 C) (Oral)   Ht 6' 0.5" (1.842 m)   Wt 232 lb (105.2 kg)   SpO2  98%   BMI 31.03 kg/m    Objective:   Physical Exam  Constitutional: He is oriented to person, place, and time. He appears well-nourished.  HENT:  Right Ear: Tympanic membrane and ear canal normal.  Left Ear: Tympanic membrane and ear canal normal.  Nose: Nose normal. Right sinus exhibits no maxillary sinus tenderness and no frontal sinus tenderness. Left sinus exhibits no maxillary sinus tenderness and no frontal sinus tenderness.  Mouth/Throat: Oropharynx is clear and moist.  Large uvula  Eyes: Conjunctivae and EOM are normal. Pupils are equal, round, and reactive to light.  Neck: Neck supple. Carotid bruit is not present. No thyromegaly present.  Cardiovascular: Normal rate,  regular rhythm and normal heart sounds.  Pulmonary/Chest: Effort normal and breath sounds normal. He has no wheezes. He has no rales.  Abdominal: Soft. Bowel sounds are normal. There is no tenderness.  Musculoskeletal: Normal range of motion.  Neurological: He is alert and oriented to person, place, and time. He has normal reflexes. No cranial nerve deficit.  Skin: Skin is warm and dry.  Psychiatric: He has a normal mood and affect.          Assessment & Plan:

## 2018-03-08 ENCOUNTER — Other Ambulatory Visit: Payer: Self-pay | Admitting: Primary Care

## 2018-03-08 DIAGNOSIS — I10 Essential (primary) hypertension: Secondary | ICD-10-CM

## 2018-03-17 ENCOUNTER — Institutional Professional Consult (permissible substitution): Payer: Managed Care, Other (non HMO) | Admitting: Pulmonary Disease

## 2018-04-13 ENCOUNTER — Other Ambulatory Visit: Payer: Self-pay | Admitting: Primary Care

## 2018-04-13 DIAGNOSIS — J309 Allergic rhinitis, unspecified: Secondary | ICD-10-CM

## 2018-04-14 NOTE — Telephone Encounter (Addendum)
Per DPR, left detail message of Edward Mccarty comments for patient to call back.  Will send 30 supply.

## 2018-04-17 ENCOUNTER — Other Ambulatory Visit: Payer: Self-pay | Admitting: *Deleted

## 2018-04-17 ENCOUNTER — Telehealth: Payer: Self-pay | Admitting: Primary Care

## 2018-04-17 DIAGNOSIS — J309 Allergic rhinitis, unspecified: Secondary | ICD-10-CM

## 2018-04-17 MED ORDER — LORATADINE 10 MG PO TABS: 10.0000 mg | ORAL_TABLET | Freq: Every day | ORAL | 0 refills | Status: DC

## 2018-04-17 NOTE — Telephone Encounter (Signed)
Rx for Flonase has already been filled by the office. Claritin Rx has been sent in to the pharmacy per protocol.

## 2018-04-17 NOTE — Telephone Encounter (Signed)
Copied from [place] [id number]. Topic: Quick Communication - Rx Refill/Question >> Apr 17, 2018 11:55 AM Scherrie Gerlach wrote: Medication: loratadine (CLARITIN) 10 MG tablet  [id number] [place], [place] - [address] AT [place] [phone] (Phone) [phone] (Fax)  Pt wants the doctor to know this nose spray only works for about 30 min after he spray, then his head is stopped up again.  But he will continue to try fluticasone (FLONASE) 50 MCG/ACT nasal spray

## 2018-04-28 ENCOUNTER — Encounter: Payer: Self-pay | Admitting: Pulmonary Disease

## 2018-04-28 ENCOUNTER — Ambulatory Visit (INDEPENDENT_AMBULATORY_CARE_PROVIDER_SITE_OTHER): Payer: Managed Care, Other (non HMO) | Admitting: Pulmonary Disease

## 2018-04-28 VITALS — BP 152/88 | HR 87 | Ht 72.0 in | Wt 243.3 lb

## 2018-04-28 DIAGNOSIS — R0683 Snoring: Secondary | ICD-10-CM

## 2018-04-28 NOTE — Progress Notes (Signed)
[facility]  Chief Complaint  Patient presents with  . sleep consult    Pt referred by Dr. Alma Friendly MD. Pt snores loudly, stops breathing at times, and has some daytime sleepiness. Pt wakes himself up with restless legs in middle of the night.    Vital signs: BP (!) 152/88 (BP Location: Left Arm, Cuff Size: Normal)   Pulse 87   Ht 6' (1.829 m)   Wt 243 lb 4.8 oz (110.4 kg)   SpO2 96%   BMI 33.00 kg/m   History of Present Illness: Edward Mccarty is a 47 y.o. male for evaluation of sleep problems.  He travels a lot for work, and shares hotel rooms with coworkers.  He has been told for years that he snores, and stops breathing while asleep.  His wife recorded his sleep, and showed him doing these things.  He gets funny leg symptoms at night, but has chronic back pain.  He has noticed getting more sleepy during the day.  He goes to sleep at 10 pm.  He falls asleep quickly.  He wakes up several times during the night due to back pain.  He gets out of bed at 350 am.  He feels tired in the morning.  He denies morning headache.  He does not use anything to help him fall sleep or stay awake.  He denies sleep walking, sleep talking, bruxism, or nightmares.  There is no history of restless legs.  He denies sleep hallucinations, sleep paralysis, or cataplexy.  The Epworth score is 3 out of 24.     Physical Exam:  General - pleasant Eyes - pupils reactive ENT - no sinus tenderness, no oral exudate, no LAN, wears denture, MP 3 Cardiac - regular, no murmur Chest - no wheeze, rales Abd - soft, non tender Ext - no edema Skin - no rashes Neuro - normal strength Psych - normal mood  Discussion: He has snoring, sleep disruption, witnessed apnea, and daytime sleepiness.  He has history of hypertension.  I am concerned he could have sleep apnea.  We discussed how sleep apnea can affect various health problems, including risks for  hypertension, cardiovascular disease, and diabetes.  We also discussed how sleep disruption can increase risks for accidents, such as while driving.  Weight loss as a means of improving sleep apnea was also reviewed.  Additional treatment options discussed were CPAP therapy, oral appliance, and surgical intervention.  Assessment/Plan:  Snoring. - will arrange for home sleep study  Hypertension. - advised him to f/u with his PCP   Patient Instructions  Will arrange for home sleep study Will call to arrange for follow up after sleep study reviewed     Chesley Mires, MD [place] 04/28/2018, 9:39 AM  Flow Sheet  Sleep tests:  Review of Systems:  Constitutional: Negative for fever and unexpected weight change.  HENT: Negative for congestion, dental problem, ear pain, nosebleeds, postnasal drip, rhinorrhea, sinus pressure, sneezing, sore throat and trouble swallowing.   Eyes: Negative for redness and itching.  Respiratory: Positive for shortness of breath. Negative for cough, chest tightness and wheezing.   Cardiovascular: Negative for palpitations and leg swelling.  Gastrointestinal: Negative for nausea and vomiting.  Genitourinary: Negative for dysuria.  Musculoskeletal: Negative for joint swelling.  Skin: Negative for rash.  Allergic/Immunologic: Negative.  Negative for environmental allergies, food allergies and immunocompromised state.  Neurological: Negative for headaches.  Hematological: Does not bruise/bleed easily.  Psychiatric/Behavioral: Negative for  dysphoric mood. The patient is not nervous/anxious.    Past Medical History: He  has a past medical history of Arthritis, Heart murmur, Hypertension, and Peyronie's disease.  Past Surgical History: He has not had any prior surgeries  Family History: His family history includes Diabetes in his brother and father; Heart disease in his father and mother; Hypertension in his father and mother; Lung  cancer in his paternal grandmother; Stroke in his father and mother.  Social History: He  reports that he quit smoking about 26 years ago. His smoking use included cigarettes. He has a 1.00 pack-year smoking history. He has never used smokeless tobacco. He reports that he has current or past drug history. Drug: Marijuana. He reports that he does not drink alcohol.  Medications: Allergies as of 04/28/2018   No Known Allergies     Medication List        Accurate as of 04/28/18  9:39 AM. Always use your most recent med list.          fluticasone 50 MCG/ACT nasal spray Commonly known as:  FLONASE PLACE 1 SPRAY INTO BOTH NOSTRILS 2 (TWO) TIMES DAILY AS NEEDED FOR ALLERGIES OR RHINITIS.   lisinopril 20 MG tablet Commonly known as:  PRINIVIL,ZESTRIL TAKE 1 TABLET BY MOUTH EVERY DAY   loratadine 10 MG tablet Commonly known as:  CLARITIN Take 1 tablet (10 mg total) by mouth daily.

## 2018-04-28 NOTE — Progress Notes (Signed)
   Subjective:    Patient ID: Edward Mccarty, male    DOB: [birth year], 47 y.o.   MRN: [id number]  HPI    Review of Systems  Constitutional: Negative for fever and unexpected weight change.  HENT: Negative for congestion, dental problem, ear pain, nosebleeds, postnasal drip, rhinorrhea, sinus pressure, sneezing, sore throat and trouble swallowing.   Eyes: Negative for redness and itching.  Respiratory: Positive for shortness of breath. Negative for cough, chest tightness and wheezing.   Cardiovascular: Negative for palpitations and leg swelling.  Gastrointestinal: Negative for nausea and vomiting.  Genitourinary: Negative for dysuria.  Musculoskeletal: Negative for joint swelling.  Skin: Negative for rash.  Allergic/Immunologic: Negative.  Negative for environmental allergies, food allergies and immunocompromised state.  Neurological: Negative for headaches.  Hematological: Does not bruise/bleed easily.  Psychiatric/Behavioral: Negative for dysphoric mood. The patient is not nervous/anxious.        Objective:   Physical Exam        Assessment & Plan:

## 2018-04-28 NOTE — Patient Instructions (Signed)
Will arrange for home sleep study Will call to arrange for follow up after sleep study reviewed  

## 2018-05-12 ENCOUNTER — Other Ambulatory Visit: Payer: Self-pay | Admitting: Primary Care

## 2018-05-12 DIAGNOSIS — J309 Allergic rhinitis, unspecified: Secondary | ICD-10-CM

## 2018-05-13 DIAGNOSIS — G4733 Obstructive sleep apnea (adult) (pediatric): Secondary | ICD-10-CM | POA: Diagnosis not present

## 2018-05-15 ENCOUNTER — Encounter: Payer: Self-pay | Admitting: Pulmonary Disease

## 2018-05-15 ENCOUNTER — Telehealth: Payer: Self-pay | Admitting: Pulmonary Disease

## 2018-05-15 DIAGNOSIS — G4733 Obstructive sleep apnea (adult) (pediatric): Secondary | ICD-10-CM | POA: Diagnosis not present

## 2018-05-15 HISTORY — DX: Obstructive sleep apnea (adult) (pediatric): G47.33

## 2018-05-15 NOTE — Telephone Encounter (Signed)
HST 05/13/18 >> AHI 33.7, SaO2 low 84%   Will have my nurse inform pt that sleep study shows severe sleep apnea.  Options are 1) CPAP now, 2) ROV first.  If He is agreeable to CPAP, then please send order for auto CPAP range 5 to 15 cm H2O with heated humidity and mask of choice.  Have download sent 1 month after starting CPAP and set up ROV 2 months after starting CPAP.  ROV can be with me or NP.

## 2018-05-16 ENCOUNTER — Other Ambulatory Visit: Payer: Self-pay | Admitting: *Deleted

## 2018-05-16 DIAGNOSIS — R0683 Snoring: Secondary | ICD-10-CM

## 2018-05-16 NOTE — Telephone Encounter (Signed)
LMOM will call back. 

## 2018-05-20 NOTE — Telephone Encounter (Signed)
Called and spoke with patient regarding results.  Informed the patient of results and recommendations today. Pt agreed to be set up on new cpap machine due to VS recommendations. Placed order today for auto CPAP range 5 to 15 cm H2O with heated humidity and mask of choice.   Have download sent 1 month after starting CPAP and set up ROV 2 months after starting cpap machine. Pt verbalized understanding and denied any questions or concerns at this time.  Nothing further needed.

## 2018-07-27 ENCOUNTER — Other Ambulatory Visit: Payer: Self-pay | Admitting: Primary Care

## 2018-07-27 DIAGNOSIS — J309 Allergic rhinitis, unspecified: Secondary | ICD-10-CM

## 2018-09-30 ENCOUNTER — Other Ambulatory Visit: Payer: Self-pay | Admitting: Primary Care

## 2018-09-30 DIAGNOSIS — I10 Essential (primary) hypertension: Secondary | ICD-10-CM

## 2018-10-29 ENCOUNTER — Other Ambulatory Visit: Payer: Self-pay | Admitting: Primary Care

## 2018-10-29 DIAGNOSIS — J309 Allergic rhinitis, unspecified: Secondary | ICD-10-CM

## 2018-12-22 ENCOUNTER — Other Ambulatory Visit: Payer: Self-pay | Admitting: Primary Care

## 2018-12-22 DIAGNOSIS — J309 Allergic rhinitis, unspecified: Secondary | ICD-10-CM

## 2019-01-13 ENCOUNTER — Other Ambulatory Visit: Payer: Self-pay | Admitting: Primary Care

## 2019-01-13 DIAGNOSIS — J309 Allergic rhinitis, unspecified: Secondary | ICD-10-CM

## 2019-01-20 ENCOUNTER — Encounter: Payer: Self-pay | Admitting: Primary Care

## 2019-01-20 ENCOUNTER — Ambulatory Visit (INDEPENDENT_AMBULATORY_CARE_PROVIDER_SITE_OTHER): Payer: Managed Care, Other (non HMO) | Admitting: Primary Care

## 2019-01-20 VITALS — BP 124/86 | HR 81 | Temp 98.0°F | Ht 72.0 in | Wt 234.0 lb

## 2019-01-20 DIAGNOSIS — Z0000 Encounter for general adult medical examination without abnormal findings: Secondary | ICD-10-CM

## 2019-01-20 DIAGNOSIS — I10 Essential (primary) hypertension: Secondary | ICD-10-CM | POA: Diagnosis not present

## 2019-01-20 DIAGNOSIS — J309 Allergic rhinitis, unspecified: Secondary | ICD-10-CM | POA: Diagnosis not present

## 2019-01-20 DIAGNOSIS — N529 Male erectile dysfunction, unspecified: Secondary | ICD-10-CM

## 2019-01-20 DIAGNOSIS — G4733 Obstructive sleep apnea (adult) (pediatric): Secondary | ICD-10-CM | POA: Diagnosis not present

## 2019-01-20 DIAGNOSIS — Z23 Encounter for immunization: Secondary | ICD-10-CM

## 2019-01-20 LAB — COMPREHENSIVE METABOLIC PANEL
ALK PHOS: 66 U/L (ref 39–117)
ALT: 21 U/L (ref 0–53)
AST: 18 U/L (ref 0–37)
Albumin: 4.7 g/dL (ref 3.5–5.2)
BUN: 16 mg/dL (ref 6–23)
CO2: 31 mEq/L (ref 19–32)
Calcium: 9.4 mg/dL (ref 8.4–10.5)
Chloride: 100 mEq/L (ref 96–112)
Creatinine, Ser: 1.24 mg/dL (ref 0.40–1.50)
GFR: 62.64 mL/min (ref >60.00)
Glucose, Bld: 102 mg/dL — ABNORMAL HIGH (ref 70–99)
POTASSIUM: 4.2 meq/L (ref 3.5–5.1)
Sodium: 138 mEq/L (ref 135–145)
Total Bilirubin: 1 mg/dL (ref 0.2–1.2)
Total Protein: 7.9 g/dL (ref 6.0–8.3)

## 2019-01-20 LAB — LDL CHOLESTEROL, DIRECT: Direct LDL: 151 mg/dL

## 2019-01-20 LAB — LIPID PANEL
Cholesterol: 203 mg/dL — ABNORMAL HIGH (ref 0–200)
HDL: 41.8 mg/dL (ref >39.00)
NonHDL: 161.26
Total CHOL/HDL Ratio: 5
Triglycerides: 211 mg/dL — ABNORMAL HIGH (ref 0.0–149.0)
VLDL: 42.2 mg/dL — AB (ref 0.0–40.0)

## 2019-01-20 LAB — TSH: TSH: 0.99 u[IU]/mL (ref 0.35–4.50)

## 2019-01-20 MED ORDER — LORATADINE 10 MG PO TABS: 10.0000 mg | ORAL_TABLET | Freq: Every day | ORAL | 3 refills | Status: DC

## 2019-01-20 MED ORDER — FLUTICASONE PROPIONATE 50 MCG/ACT NA SUSP: 1.0000 | Freq: Two times a day (BID) | NASAL | 1 refills | Status: DC | PRN

## 2019-01-20 MED ORDER — LISINOPRIL 20 MG PO TABS: 20.0000 mg | ORAL_TABLET | Freq: Every day | ORAL | 3 refills | Status: DC

## 2019-01-20 MED ORDER — SILDENAFIL CITRATE 20 MG PO TABS: ORAL_TABLET | ORAL | 0 refills | Status: DC

## 2019-01-20 NOTE — Progress Notes (Signed)
Subjective:    Patient ID: Edward Mccarty, male    DOB: 04-Feb-1971, 48 y.o.   MRN: [id number]  HPI  Edward Mccarty is a 48 year old male who presents today for complete physical.  Immunizations: -Tetanus: Completed in 2018 -Influenza: Due today   Diet: He endorses a healthy diet Breakfast: Skips  Lunch: Fast food (salads with protein) Dinner: Sometimes skips, fast food  Snacks: None Desserts: Several times weekly Beverages: Water, flavored water, some soda, sweet tea  Exercise: He is exercising at the gym 5 days weekly for one hour. Lifts weights mostly.  Eye exam: No recent exam Dental exam: No recent exam, dentures  Wt Readings from Last 3 Encounters:  01/20/19 234 lb (106.1 kg)  04/28/18 243 lb 4.8 oz (110.4 kg)  01/22/18 232 lb (105.2 kg)   BP Readings from Last 3 Encounters:  01/20/19 124/86  04/28/18 (!) 152/88  01/22/18 118/70     Review of Systems  Constitutional: Negative for unexpected weight change.  HENT: Negative for rhinorrhea.   Respiratory: Negative for cough and shortness of breath.   Cardiovascular: Negative for chest pain.  Gastrointestinal: Negative for constipation and diarrhea.  Genitourinary: Negative for difficulty urinating.       Difficulty maintaining an erection and will have to wait several days before having intercourse again as he will be unable to obtain erection. He took one dose of Royal Honey OTC and noticed improvement.   Musculoskeletal:       Chronic lower back pain  Skin: Negative for rash.  Allergic/Immunologic: Negative for environmental allergies.  Neurological: Negative for dizziness, numbness and headaches.  Psychiatric/Behavioral: The patient is not nervous/anxious.        Past Medical History:  Diagnosis Date  . Arthritis   . Heart murmur   . Hypertension   . OSA (obstructive sleep apnea) 05/15/2018  . Peyronie's disease      Social History   Socioeconomic History  . Marital status: Married    Spouse name:  Not on file  . Number of children: Not on file  . Years of education: Not on file  . Highest education level: Not on file  Occupational History  . Not on file  Social Needs  . Financial resource strain: Not on file  . Food insecurity:    Worry: Not on file    Inability: Not on file  . Transportation needs:    Medical: Not on file    Non-medical: Not on file  Tobacco Use  . Smoking status: Former Smoker    Packs/day: 0.25    Years: 4.00    Pack years: 1.00    Types: Cigarettes    Last attempt to quit: 05/21/1991    Years since quitting: 27.6  . Smokeless tobacco: Never Used  Substance and Sexual Activity  . Alcohol use: No  . Drug use: Not Currently    Types: Marijuana  . Sexual activity: Not on file  Lifestyle  . Physical activity:    Days per week: Not on file    Minutes per session: Not on file  . Stress: Not on file  Relationships  . Social connections:    Talks on phone: Not on file    Gets together: Not on file    Attends religious service: Not on file    Active member of club or organization: Not on file    Attends meetings of clubs or organizations: Not on file    Relationship status: Not on  file  . Intimate partner violence:    Fear of current or ex partner: Not on file    Emotionally abused: Not on file    Physically abused: Not on file    Forced sexual activity: Not on file  Other Topics Concern  . Not on file  Social History Narrative   Married.   3 children.   Works as an Radio broadcast assistant.   Enjoys working out, sleeping, relaxing.     No past surgical history on file.  Family History  Problem Relation Age of Onset  . Hypertension Mother   . Stroke Mother   . Heart disease Mother   . Hypertension Father   . Stroke Father   . Diabetes Father   . Heart disease Father   . Diabetes Brother   . Lung cancer Paternal Grandmother     No Known Allergies  No current outpatient medications on file prior to visit.   No current  facility-administered medications on file prior to visit.     BP 124/86   Pulse 81   Temp 98 F (36.7 C) (Oral)   Ht 6' (1.829 m)   Wt 234 lb (106.1 kg)   SpO2 98%   BMI 31.74 kg/m    Objective:   Physical Exam  Constitutional: He is oriented to person, place, and time. He appears well-nourished.  HENT:  Mouth/Throat: No oropharyngeal exudate.  Eyes: Pupils are equal, round, and reactive to light. EOM are normal.  Neck: Neck supple. No thyromegaly present.  Cardiovascular: Normal rate and regular rhythm.  Respiratory: Effort normal and breath sounds normal.  GI: Soft. Bowel sounds are normal. There is no abdominal tenderness.  Musculoskeletal: Normal range of motion.  Neurological: He is alert and oriented to person, place, and time.  Skin: Skin is warm and dry.  Psychiatric: He has a normal mood and affect.           Assessment & Plan:

## 2019-01-20 NOTE — Assessment & Plan Note (Signed)
Stable in the office today, continue lisinopril. BMP pending.

## 2019-01-20 NOTE — Assessment & Plan Note (Signed)
Not compliant to CPAP machine recently. Discussed to resume immediately.

## 2019-01-20 NOTE — Assessment & Plan Note (Signed)
Tetanus UTD, influenza vaccination provided today. Commended him on regular exercise, recommended cardio twice weekly. Discussed to limit fast food, sodas, sweet tea. Increase home cooked meals, water. Exam unremarkable. Labs pending. Follow up in 1 year for CPE.

## 2019-01-20 NOTE — Assessment & Plan Note (Signed)
Chronic for years.  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 Will have him stop OTC treatment, start sildenafil PRN. Instructions provided. He will update.

## 2019-01-20 NOTE — Assessment & Plan Note (Signed)
Doing well on Claritin daily, using Flonase PRN. Continue same.

## 2019-01-20 NOTE — Patient Instructions (Signed)
Continue exercising. You should be getting 150 minutes of moderate intensity exercise weekly.  It's important to improve your diet by reducing consumption of fast food, fried food, processed snack foods, sugary drinks. Increase consumption of fresh vegetables and fruits, whole grains, water.  Ensure you are drinking 64 ounces of water daily.  You can try sildenafil as needed. Take 2-5 tablets 30 minutes prior to intercourse. Start with 2 tablets.  We will see you next year or sooner if needed. It was a pleasure to see you today!   Preventive Care 40-64 Years, Male Preventive care refers to lifestyle choices and visits with your health care provider that can promote health and wellness. What does preventive care include?   A yearly physical exam. This is also called an annual well check.  Dental exams once or twice a year.  Routine eye exams. Ask your health care provider how often you should have your eyes checked.  Personal lifestyle choices, including: ? Daily care of your teeth and gums. ? Regular physical activity. ? Eating a healthy diet. ? Avoiding tobacco and drug use. ? Limiting alcohol use. ? Practicing safe sex. ? Taking low-dose aspirin every day starting at age 69. What happens during an annual well check? The services and screenings done by your health care provider during your annual well check will depend on your age, overall health, lifestyle risk factors, and family history of disease. Counseling Your health care provider may ask you questions about your:  Alcohol use.  Tobacco use.  Drug use.  Emotional well-being.  Home and relationship well-being.  Sexual activity.  Eating habits.  Work and work Statistician. Screening You may have the following tests or measurements:  Height, weight, and BMI.  Blood pressure.  Lipid and cholesterol levels. These may be checked every 5 years, or more frequently if you are over 51 years old.  Skin  check.  Lung cancer screening. You may have this screening every year starting at age 57 if you have a 30-pack-year history of smoking and currently smoke or have quit within the past 15 years.  Colorectal cancer screening. All adults should have this screening starting at age 53 and continuing until age 61. Your health care provider may recommend screening at age 90. You will have tests every 1-10 years, depending on your results and the type of screening test. People at increased risk should start screening at an earlier age. Screening tests may include: ? Guaiac-based fecal occult blood testing. ? Fecal immunochemical test (FIT). ? Stool DNA test. ? Virtual colonoscopy. ? Sigmoidoscopy. During this test, a flexible tube with a tiny camera (sigmoidoscope) is used to examine your rectum and lower colon. The sigmoidoscope is inserted through your anus into your rectum and lower colon. ? Colonoscopy. During this test, a long, thin, flexible tube with a tiny camera (colonoscope) is used to examine your entire colon and rectum.  Prostate cancer screening. Recommendations will vary depending on your family history and other risks.  Hepatitis C blood test.  Hepatitis B blood test.  Sexually transmitted disease (STD) testing.  Diabetes screening. This is done by checking your blood sugar (glucose) after you have not eaten for a while (fasting). You may have this done every 1-3 years. Discuss your test results, treatment options, and if necessary, the need for more tests with your health care provider. Vaccines Your health care provider may recommend certain vaccines, such as:  Influenza vaccine. This is recommended every year.  Tetanus, diphtheria, and  acellular pertussis (Tdap, Td) vaccine. You may need a Td booster every 10 years.  Varicella vaccine. You may need this if you have not been vaccinated.  Zoster vaccine. You may need this after age 56.  Measles, mumps, and rubella (MMR)  vaccine. You may need at least one dose of MMR if you were born in 1957 or later. You may also need a second dose.  Pneumococcal 13-valent conjugate (PCV13) vaccine. You may need this if you have certain conditions and have not been vaccinated.  Pneumococcal polysaccharide (PPSV23) vaccine. You may need one or two doses if you smoke cigarettes or if you have certain conditions.  Meningococcal vaccine. You may need this if you have certain conditions.  Hepatitis A vaccine. You may need this if you have certain conditions or if you travel or work in places where you may be exposed to hepatitis A.  Hepatitis B vaccine. You may need this if you have certain conditions or if you travel or work in places where you may be exposed to hepatitis B.  Haemophilus influenzae type b (Hib) vaccine. You may need this if you have certain risk factors. Talk to your health care provider about which screenings and vaccines you need and how often you need them. This information is not intended to replace advice given to you by your health care provider. Make sure you discuss any questions you have with your health care provider. Document Released: 12/09/2015 Document Revised: 01/02/2018 Document Reviewed: 09/13/2015 Elsevier Interactive Patient Education  2019 [employer].

## 2019-01-21 NOTE — Addendum Note (Signed)
Addended by: Jacqualin Combes on: 01/21/2019 10:57 AM   Modules accepted: Orders

## 2019-01-28 ENCOUNTER — Telehealth: Payer: Self-pay | Admitting: Primary Care

## 2019-01-28 NOTE — Telephone Encounter (Signed)
Send patient a message through MyChart. 

## 2019-01-28 NOTE — Telephone Encounter (Signed)
Pt need refill and prior auth for   Sildenafil 20mg    He talked to [facility] and they stated if prior auth done today the medication will be denied.

## 2019-02-03 DIAGNOSIS — N529 Male erectile dysfunction, unspecified: Secondary | ICD-10-CM

## 2019-02-04 MED ORDER — SILDENAFIL CITRATE 50 MG PO TABS: ORAL_TABLET | ORAL | 0 refills | Status: DC

## 2019-02-11 ENCOUNTER — Other Ambulatory Visit: Payer: Self-pay | Admitting: Primary Care

## 2019-02-11 DIAGNOSIS — J309 Allergic rhinitis, unspecified: Secondary | ICD-10-CM

## 2019-02-25 ENCOUNTER — Other Ambulatory Visit: Payer: Self-pay | Admitting: Primary Care

## 2019-02-25 DIAGNOSIS — N529 Male erectile dysfunction, unspecified: Secondary | ICD-10-CM

## 2019-03-05 ENCOUNTER — Other Ambulatory Visit: Payer: Self-pay | Admitting: Primary Care

## 2019-03-05 DIAGNOSIS — J309 Allergic rhinitis, unspecified: Secondary | ICD-10-CM

## 2019-03-05 MED ORDER — FLUTICASONE PROPIONATE 50 MCG/ACT NA SUSP: NASAL | 2 refills | Status: DC

## 2019-03-05 NOTE — Addendum Note (Signed)
Addended by: Jacqualin Combes on: 03/05/2019 01:08 PM   Modules accepted: Orders

## 2019-03-15 ENCOUNTER — Other Ambulatory Visit: Payer: Self-pay

## 2019-03-15 DIAGNOSIS — N529 Male erectile dysfunction, unspecified: Secondary | ICD-10-CM

## 2019-03-16 MED ORDER — SILDENAFIL CITRATE 50 MG PO TABS: ORAL_TABLET | ORAL | 0 refills | Status: DC

## 2019-03-16 NOTE — Telephone Encounter (Signed)
Noted.  Refill sent to pharmacy. 

## 2019-03-16 NOTE — Telephone Encounter (Signed)
Last filled 02-04-19 #10 Last OV/CPE 01-20-19 No Future OV [facility]

## 2019-05-27 ENCOUNTER — Inpatient Hospital Stay (HOSPITAL_COMMUNITY)
Admission: EM | Admit: 2019-05-27 | Discharge: 2019-05-30 | DRG: 388 | Disposition: A | Discharge status: Home / Self Care | Payer: Managed Care, Other (non HMO) | Attending: Student | Admitting: Student

## 2019-05-27 ENCOUNTER — Emergency Department (HOSPITAL_COMMUNITY): Payer: Managed Care, Other (non HMO)

## 2019-05-27 ENCOUNTER — Encounter (HOSPITAL_COMMUNITY): Payer: Self-pay | Admitting: Emergency Medicine

## 2019-05-27 ENCOUNTER — Other Ambulatory Visit: Payer: Self-pay

## 2019-05-27 DIAGNOSIS — T464X5A Adverse effect of angiotensin-converting-enzyme inhibitors, initial encounter: Secondary | ICD-10-CM | POA: Diagnosis present

## 2019-05-27 DIAGNOSIS — I10 Essential (primary) hypertension: Secondary | ICD-10-CM | POA: Diagnosis present

## 2019-05-27 DIAGNOSIS — Z87891 Personal history of nicotine dependence: Secondary | ICD-10-CM | POA: Diagnosis not present

## 2019-05-27 DIAGNOSIS — K529 Noninfective gastroenteritis and colitis, unspecified: Secondary | ICD-10-CM | POA: Diagnosis present

## 2019-05-27 DIAGNOSIS — R079 Chest pain, unspecified: Secondary | ICD-10-CM | POA: Diagnosis present

## 2019-05-27 DIAGNOSIS — K56600 Partial intestinal obstruction, unspecified as to cause: Secondary | ICD-10-CM

## 2019-05-27 DIAGNOSIS — G4733 Obstructive sleep apnea (adult) (pediatric): Secondary | ICD-10-CM | POA: Diagnosis present

## 2019-05-27 DIAGNOSIS — Z833 Family history of diabetes mellitus: Secondary | ICD-10-CM | POA: Diagnosis not present

## 2019-05-27 DIAGNOSIS — Z801 Family history of malignant neoplasm of trachea, bronchus and lung: Secondary | ICD-10-CM

## 2019-05-27 DIAGNOSIS — N170 Acute kidney failure with tubular necrosis: Secondary | ICD-10-CM | POA: Diagnosis present

## 2019-05-27 DIAGNOSIS — Z7951 Long term (current) use of inhaled steroids: Secondary | ICD-10-CM

## 2019-05-27 DIAGNOSIS — Z79899 Other long term (current) drug therapy: Secondary | ICD-10-CM

## 2019-05-27 DIAGNOSIS — D72829 Elevated white blood cell count, unspecified: Secondary | ICD-10-CM

## 2019-05-27 DIAGNOSIS — Z823 Family history of stroke: Secondary | ICD-10-CM

## 2019-05-27 DIAGNOSIS — Z1159 Encounter for screening for other viral diseases: Secondary | ICD-10-CM

## 2019-05-27 DIAGNOSIS — Z114 Encounter for screening for human immunodeficiency virus [HIV]: Secondary | ICD-10-CM

## 2019-05-27 DIAGNOSIS — Z8249 Family history of ischemic heart disease and other diseases of the circulatory system: Secondary | ICD-10-CM | POA: Diagnosis not present

## 2019-05-27 DIAGNOSIS — N179 Acute kidney failure, unspecified: Secondary | ICD-10-CM

## 2019-05-27 DIAGNOSIS — E860 Dehydration: Secondary | ICD-10-CM | POA: Diagnosis present

## 2019-05-27 DIAGNOSIS — Z4659 Encounter for fitting and adjustment of other gastrointestinal appliance and device: Secondary | ICD-10-CM

## 2019-05-27 DIAGNOSIS — K56609 Unspecified intestinal obstruction, unspecified as to partial versus complete obstruction: Secondary | ICD-10-CM

## 2019-05-27 HISTORY — DX: Partial intestinal obstruction, unspecified as to cause: K56.600

## 2019-05-27 LAB — HEPATIC FUNCTION PANEL
ALT: 26 U/L (ref 0–44)
AST: 25 U/L (ref 15–41)
Albumin: 4.5 g/dL (ref 3.5–5.0)
Alkaline Phosphatase: 57 U/L (ref 38–126)
Bilirubin, Direct: 0.3 mg/dL — ABNORMAL HIGH (ref 0.0–0.2)
Indirect Bilirubin: 1.2 mg/dL — ABNORMAL HIGH (ref 0.3–0.9)
Total Bilirubin: 1.5 mg/dL — ABNORMAL HIGH (ref 0.3–1.2)
Total Protein: 8.1 g/dL (ref 6.5–8.1)

## 2019-05-27 LAB — BASIC METABOLIC PANEL
Anion gap: 15 (ref 5–15)
BUN: 17 mg/dL (ref 6–20)
CO2: 23 mmol/L (ref 22–32)
Calcium: 9.8 mg/dL (ref 8.9–10.3)
Chloride: 99 mmol/L (ref 98–111)
Creatinine, Ser: 1.57 mg/dL — ABNORMAL HIGH (ref 0.61–1.24)
GFR calc Af Amer: >60 mL/min (ref >60)
GFR calc non Af Amer: 52 mL/min — ABNORMAL LOW (ref >60)
Glucose, Bld: 159 mg/dL — ABNORMAL HIGH (ref 70–99)
Potassium: 4 mmol/L (ref 3.5–5.1)
Sodium: 137 mmol/L (ref 135–145)

## 2019-05-27 LAB — LIPASE, BLOOD: Lipase: 29 U/L (ref 11–51)

## 2019-05-27 LAB — CBC
HCT: 47.1 % (ref 39.0–52.0)
Hemoglobin: 16.2 g/dL (ref 13.0–17.0)
MCH: 30.5 pg (ref 26.0–34.0)
MCHC: 34.4 g/dL (ref 30.0–36.0)
MCV: 88.5 fL (ref 80.0–100.0)
Platelets: 318 10*3/uL (ref 150–400)
RBC: 5.32 MIL/uL (ref 4.22–5.81)
RDW: 12.1 % (ref 11.5–15.5)
WBC: 26.3 10*3/uL — ABNORMAL HIGH (ref 4.0–10.5)
nRBC: 0 % (ref 0.0–0.2)

## 2019-05-27 LAB — TROPONIN I (HIGH SENSITIVITY)
Troponin I (High Sensitivity): <2 ng/L (ref <18)
Troponin I (High Sensitivity): <2 ng/L (ref <18)

## 2019-05-27 MED ORDER — SODIUM CHLORIDE 0.9% FLUSH
3.0000 mL | Freq: Once | INTRAVENOUS | Status: AC
  Administered 2019-05-28: 3 mL via INTRAVENOUS

## 2019-05-27 MED ORDER — SODIUM CHLORIDE 0.9 % IV BOLUS
1000.0000 mL | Freq: Once | INTRAVENOUS | Status: AC
  Administered 2019-05-27: 1000 mL via INTRAVENOUS

## 2019-05-27 MED ORDER — ONDANSETRON HCL 4 MG/2ML IJ SOLN
4.0000 mg | Freq: Once | INTRAMUSCULAR | Status: AC
  Administered 2019-05-27: 4 mg via INTRAVENOUS
  Filled 2019-05-27: qty 2

## 2019-05-27 MED ORDER — MORPHINE SULFATE (PF) 4 MG/ML IV SOLN
4.0000 mg | Freq: Once | INTRAVENOUS | Status: AC
  Administered 2019-05-27: 4 mg via INTRAVENOUS
  Filled 2019-05-27: qty 1

## 2019-05-27 MED ORDER — IOHEXOL 300 MG/ML  SOLN
100.0000 mL | Freq: Once | INTRAMUSCULAR | Status: AC | PRN
  Administered 2019-05-27: 100 mL via INTRAVENOUS

## 2019-05-27 NOTE — ED Notes (Signed)
epigastgric pain after he ate lunch at 1330  vomitng and diarfrfhea since then with the pain continuing.

## 2019-05-27 NOTE — ED Notes (Signed)
t reports he feels better he just returned from c-t

## 2019-05-27 NOTE — ED Triage Notes (Signed)
Pt reports chest pain that started after eating. It moves b/c his abdomen and chest.  Some relief w/ "burps".

## 2019-05-27 NOTE — H&P (Signed)
History and Physical    WALLER MARCUSSEN GMW:[id number] DOB: [birth year] DOA: 05/27/2019  PCP: Pleas Koch, NP Patient coming from: Home  Chief Complaint: Epigastric abdominal pain  HPI: Edward Mccarty is a 48 y.o. male with medical history significant of arthritis, hypertension, OSA presenting to the hospital for evaluation of epigastric abdominal pain, nausea, vomiting, and diarrhea.  Patient states he was in his usual state of health until lunch today.  For lunch he had a chicken sandwich and chips at [employer] and soon after started having watery diarrhea and nausea.  He experienced sharp, 8 out of 10 intensity epigastric abdominal pain.  States after arriving the emergency room he has vomited multiple times.  Every time he vomits his abdominal pain gets better.  He has not had any fevers or chills.  Denies history of prior abdominal surgeries.  ED Course: Slightly tachycardic and tachypneic on arrival, now improved.  Afebrile.  Systolic in the 440H on arrival, now improved.  White count 26.3.  Creatinine 1.5, baseline 1.2.  T bili mildly elevated at 1.5, remainder of LFTs normal.  Lipase normal.  High-sensitivity troponin x2 negative.  EKG not suggestive of ACS.  COVID-19 rapid test negative.  Chest x-ray showing no acute cardiopulmonary disease.  CT abdomen pelvis showing partial small bowel obstruction, transition point noted in the right mid abdomen without obvious cause.  Patient received morphine, Zofran, and 1 L fluid bolus in the ED. NG tube to be placed.  General surgery consulted, patient will be seen in the morning.  Review of Systems:  All systems reviewed and apart from history of presenting illness, are negative.  Past Medical History:  Diagnosis Date   Arthritis    Heart murmur    Hypertension    OSA (obstructive sleep apnea) 05/15/2018   Peyronie's disease     History reviewed. No pertinent surgical history.   reports that he quit smoking about 28 years ago. His  smoking use included cigarettes. He has a 1.00 pack-year smoking history. He has never used smokeless tobacco. He reports previous drug use. Drug: Marijuana. He reports that he does not drink alcohol.  No Known Allergies  Family History  Problem Relation Age of Onset   Hypertension Mother    Stroke Mother    Heart disease Mother    Hypertension Father    Stroke Father    Diabetes Father    Heart disease Father    Diabetes Brother    Lung cancer Paternal Grandmother     Prior to Admission medications   Medication Sig Start Date End Date Taking? Authorizing Provider  fluticasone (FLONASE) 50 MCG/ACT nasal spray PLACE 1 SPRAY INTO BOTH NOSTRILS 2 TIMES DAILY AS NEEDED FOR ALLERGIES OR RHINITIS. 03/05/19   Pleas Koch, NP  lisinopril (PRINIVIL,ZESTRIL) 20 MG tablet Take 1 tablet (20 mg total) by mouth daily. For blood pressure. 01/20/19   Pleas Koch, NP  loratadine (CLARITIN) 10 MG tablet Take 1 tablet (10 mg total) by mouth daily. For allergies. 01/20/19   Pleas Koch, NP  sildenafil (VIAGRA) 50 MG tablet Take 1/2 to 1 tablet by mouth 30 minutes prior to intercourse. 03/16/19   Pleas Koch, NP    Physical Exam: Vitals:   05/27/19 2330 05/28/19 0000 05/28/19 0030 05/28/19 0059  BP: 136/75 136/85 130/90 134/85  Pulse:    100  Resp: 16 18 19 16   Temp:    99.2 F (37.3 C)  TempSrc:    Oral  SpO2:    100%  Weight:      Height:        Physical Exam  Constitutional: He is oriented to person, place, and time. He appears well-developed and well-nourished. No distress.  HENT:  Head: Normocephalic.  Dry mucous membranes  Eyes: Right eye exhibits no discharge. Left eye exhibits no discharge.  Neck: Neck supple.  Cardiovascular: Normal rate, regular rhythm and intact distal pulses.  Pulmonary/Chest: Effort normal and breath sounds normal. No respiratory distress. He has no wheezes. He has no rales.  Abdominal: Soft. Bowel sounds are normal. He exhibits  no distension. There is abdominal tenderness. There is no rebound and no guarding.  Musculoskeletal:        General: No edema.  Neurological: He is alert and oriented to person, place, and time.  Skin: Skin is warm and dry. He is not diaphoretic.     Labs on Admission: I have personally reviewed following labs and imaging studies  CBC: Recent Labs  Lab 05/27/19 1912  WBC 26.3*  HGB 16.2  HCT 47.1  MCV 88.5  PLT 542   Basic Metabolic Panel: Recent Labs  Lab 05/27/19 1912  NA 137  K 4.0  CL 99  CO2 23  GLUCOSE 159*  BUN 17  CREATININE 1.57*  CALCIUM 9.8   GFR: Estimated Creatinine Clearance: 73.4 mL/min (A) (by C-G formula based on SCr of 1.57 mg/dL (H)). Liver Function Tests: Recent Labs  Lab 05/27/19 2106  AST 25  ALT 26  ALKPHOS 57  BILITOT 1.5*  PROT 8.1  ALBUMIN 4.5   Recent Labs  Lab 05/27/19 2106  LIPASE 29   No results for input(s): AMMONIA in the last 168 hours. Coagulation Profile: No results for input(s): INR, PROTIME in the last 168 hours. Cardiac Enzymes: No results for input(s): CKTOTAL, CKMB, CKMBINDEX, TROPONINI in the last 168 hours. BNP (last 3 results) No results for input(s): PROBNP in the last 8760 hours. HbA1C: No results for input(s): HGBA1C in the last 72 hours. CBG: No results for input(s): GLUCAP in the last 168 hours. Lipid Profile: No results for input(s): CHOL, HDL, LDLCALC, TRIG, CHOLHDL, LDLDIRECT in the last 72 hours. Thyroid Function Tests: No results for input(s): TSH, T4TOTAL, FREET4, T3FREE, THYROIDAB in the last 72 hours. Anemia Panel: No results for input(s): VITAMINB12, FOLATE, FERRITIN, TIBC, IRON, RETICCTPCT in the last 72 hours. Urine analysis: No results found for: COLORURINE, APPEARANCEUR, LABSPEC, PHURINE, GLUCOSEU, HGBUR, BILIRUBINUR, KETONESUR, PROTEINUR, UROBILINOGEN, NITRITE, LEUKOCYTESUR  Radiological Exams on Admission: Dg Chest 2 View  Result Date: 05/27/2019 CLINICAL DATA:  48 year old male  with history of central chest pain and epigastric pain today. EXAM: CHEST - 2 VIEW COMPARISON:  No priors. FINDINGS: Lung volumes are normal. No consolidative airspace disease. No pleural effusions. No pneumothorax. No pulmonary nodule or mass noted. Pulmonary vasculature and the cardiomediastinal silhouette are within normal limits. Posttraumatic deformity of the distal right clavicle related to remote trauma. IMPRESSION: No radiographic evidence of acute cardiopulmonary disease. Electronically Signed   By: Vinnie Langton M.D.   On: 05/27/2019 19:30   Ct Abdomen Pelvis W Contrast  Result Date: 05/27/2019 CLINICAL DATA:  Epigastric pain, nausea, vomiting EXAM: CT ABDOMEN AND PELVIS WITH CONTRAST TECHNIQUE: Multidetector CT imaging of the abdomen and pelvis was performed using the standard protocol following bolus administration of intravenous contrast. CONTRAST:  139mL OMNIPAQUE IOHEXOL 300 MG/ML  SOLN COMPARISON:  06/10/2005 FINDINGS: Lower chest: Lung bases are clear. No effusions. Heart is normal size. Hepatobiliary:  Diffuse low-density throughout the liver compatible with fatty infiltration. No focal abnormality. Gallbladder unremarkable. Pancreas: No focal abnormality or ductal dilatation. Spleen: No focal abnormality.  Normal size. Adrenals/Urinary Tract: No adrenal abnormality. No focal renal abnormality. No stones or hydronephrosis. Urinary bladder is unremarkable. Stomach/Bowel: There are dilated fluid-filled small bowel loops in the abdomen and pelvis. Distal small bowel is decompressed. Findings compatible with partial small bowel obstruction. The transition point appears to be in the right mid abdomen, presumably related to adhesions. Stomach and large bowel unremarkable. Vascular/Lymphatic: No evidence of aneurysm or adenopathy. Reproductive: No visible focal abnormality. Other: No free fluid or free air. Musculoskeletal: No acute bony abnormality. IMPRESSION: Partial small bowel obstruction.  Transition point noted in the right mid abdomen without obvious cause. Fatty infiltration of the liver. Electronically Signed   By: Rolm Baptise M.D.   On: 05/27/2019 22:36    EKG: Independently reviewed.  Sinus tachycardia (heart rate 118), no prior EKG for comparison.  Assessment/Plan Principal Problem:   Partial small bowel obstruction (HCC) Active Problems:   Essential hypertension   Leukocytosis   AKI (acute kidney injury) ([place])   Encounter for screening for HIV   Partial SBO CT abdomen pelvis showing partial small bowel obstruction, transition point noted in the right mid abdomen without obvious cause.  -General surgery consulted, patient will be seen in the morning. -NG tube -IV fluid hydration -Bowel rest, keep n.p.o. -IV Zofran PRN -IV morphine PRN abdominal pain -Monitor electrolytes  Leukocytosis Possibly reactive.  White count 26.3.  Patient is afebrile and nontoxic-appearing.  Imaging without evidence of intra-abdominal source of infection.  Chest x-ray not suggestive of pneumonia. -Check UA -Repeat CBC with differential in a.m.  Mild AKI Creatinine 1.5, baseline 1.2.  Likely prerenal due to dehydration. -IV fluid hydration -Avoid nephrotoxic agents -Continue to monitor renal function  Hypertension Currently normotensive. -Hydralazine PRN  HIV screening The patient falls between the ages of 13-64 and should be screened for HIV, therefore HIV testing ordered.  DVT prophylaxis: Subcutaneous heparin Code Status: Full code Family Communication: No family available at this time. Disposition Plan: Anticipate discharge after clinical improvement. Consults called: General surgery (Dr. Kieth Brightly) Admission status: It is my clinical opinion that admission to [facility] is reasonable and necessary in this 48 y.o. male  presenting with intractable nausea and vomiting secondary to partial SBO.  Patient will need bowel rest and NG tube placement.  General surgery  consultation.  Given the aforementioned, the predictability of an adverse outcome is felt to be significant. I expect that the patient will require at least 2 midnights in the hospital to treat this condition.   The medical decision making on this patient was of high complexity and the patient is at high risk for clinical deterioration, therefore this is a level 3 visit.  Shela Leff MD [facility] Pager [phone]  If 7PM-7AM, please contact night-coverage [email or web address] Password [email or web address]  05/28/2019, 2:58 AM

## 2019-05-27 NOTE — ED Provider Notes (Signed)
[facility]PART[facility] Provider Note   CSN: [id number] Arrival date & time: 05/27/19  1815    History   Chief Complaint Chief Complaint  Patient presents with  . Chest Pain    HPI Edward Mccarty is a 48 y.o. male.     Pt presents to the ED today with epigastric abdominal pain, n/v/d.  Pt said sx started after eating lunch.  He felt a little relief with burping and vomiting, but pain returns.  No f/c.  No known sick contacts.  The pt has never had anything like this in the past.       Past Medical History:  Diagnosis Date  . Arthritis   . Heart murmur   . Hypertension   . OSA (obstructive sleep apnea) 05/15/2018  . Peyronie's disease     Patient Active Problem List   Diagnosis Date Noted  . Partial small bowel obstruction ([place]) 05/27/2019  . Erectile dysfunction 01/20/2019  . OSA (obstructive sleep apnea) 05/15/2018  . Allergic rhinitis 01/22/2018  . Essential hypertension 01/21/2017  . Preventative health care 01/21/2017    History reviewed. No pertinent surgical history.      Home Medications    Prior to Admission medications   Medication Sig Start Date End Date Taking? Authorizing Provider  fluticasone (FLONASE) 50 MCG/ACT nasal spray PLACE 1 SPRAY INTO BOTH NOSTRILS 2 TIMES DAILY AS NEEDED FOR ALLERGIES OR RHINITIS. 03/05/19   Pleas Koch, NP  lisinopril (PRINIVIL,ZESTRIL) 20 MG tablet Take 1 tablet (20 mg total) by mouth daily. For blood pressure. 01/20/19   Pleas Koch, NP  loratadine (CLARITIN) 10 MG tablet Take 1 tablet (10 mg total) by mouth daily. For allergies. 01/20/19   Pleas Koch, NP  sildenafil (VIAGRA) 50 MG tablet Take 1/2 to 1 tablet by mouth 30 minutes prior to intercourse. 03/16/19   Pleas Koch, NP    Family History Family History  Problem Relation Age of Onset  . Hypertension Mother   . Stroke Mother   . Heart disease Mother   . Hypertension Father   . Stroke Father   .  Diabetes Father   . Heart disease Father   . Diabetes Brother   . Lung cancer Paternal Grandmother     Social History Social History   Tobacco Use  . Smoking status: Former Smoker    Packs/day: 0.25    Years: 4.00    Pack years: 1.00    Types: Cigarettes    Quit date: 05/21/1991    Years since quitting: 28.0  . Smokeless tobacco: Never Used  Substance Use Topics  . Alcohol use: No  . Drug use: Not Currently    Types: Marijuana     Allergies   Patient has no known allergies.   Review of Systems Review of Systems  Gastrointestinal: Positive for abdominal pain, diarrhea, nausea and vomiting.  All other systems reviewed and are negative.    Physical Exam Updated Vital Signs BP (!) 136/92   Pulse 98   Temp 98.2 F (36.8 C) (Oral)   Resp 20   Ht 6' (1.829 m)   Wt 104.3 kg   SpO2 100%   BMI 31.19 kg/m   Physical Exam Vitals signs and nursing note reviewed.  Constitutional:      Appearance: He is well-developed.  HENT:     Head: Normocephalic and atraumatic.  Eyes:     Extraocular Movements: Extraocular movements intact.     Pupils: Pupils  are equal, round, and reactive to light.  Neck:     Musculoskeletal: Normal range of motion and neck supple.  Cardiovascular:     Rate and Rhythm: Normal rate and regular rhythm.     Heart sounds: Normal heart sounds.  Pulmonary:     Effort: Pulmonary effort is normal.     Breath sounds: Normal breath sounds.  Abdominal:     Tenderness: There is abdominal tenderness in the epigastric area.  Musculoskeletal: Normal range of motion.  Skin:    General: Skin is warm and dry.     Capillary Refill: Capillary refill takes less than 2 seconds.  Neurological:     General: No focal deficit present.     Mental Status: He is alert and oriented to person, place, and time.  Psychiatric:        Mood and Affect: Mood normal.        Behavior: Behavior normal.      ED Treatments / Results  Labs (all labs ordered are listed,  but only abnormal results are displayed) Labs Reviewed  BASIC METABOLIC PANEL - Abnormal; Notable for the following components:      Result Value   Glucose, Bld 159 (*)    Creatinine, Ser 1.57 (*)    GFR calc non Af Amer 52 (*)    All other components within normal limits  CBC - Abnormal; Notable for the following components:   WBC 26.3 (*)    All other components within normal limits  HEPATIC FUNCTION PANEL - Abnormal; Notable for the following components:   Total Bilirubin 1.5 (*)    Bilirubin, Direct 0.3 (*)    Indirect Bilirubin 1.2 (*)    All other components within normal limits  SARS CORONAVIRUS 2 (HOSPITAL ORDER, [facility])  TROPONIN I (HIGH SENSITIVITY)  TROPONIN I (HIGH SENSITIVITY)  LIPASE, BLOOD    EKG EKG Interpretation  Date/Time:  Wednesday May 27 2019 18:50:45 EDT Ventricular Rate:  118 PR Interval:  146 QRS Duration: 78 QT Interval:  278 QTC Calculation: 389 R Axis:   53 Text Interpretation:  Sinus tachycardia Septal infarct , age undetermined Abnormal ECG No old tracing to compare Confirmed by Isla Pence [phone]) on 05/27/2019 9:08:16 PM   Radiology Dg Chest 2 View  Result Date: 05/27/2019 CLINICAL DATA:  48 year old male with history of central chest pain and epigastric pain today. EXAM: CHEST - 2 VIEW COMPARISON:  No priors. FINDINGS: Lung volumes are normal. No consolidative airspace disease. No pleural effusions. No pneumothorax. No pulmonary nodule or mass noted. Pulmonary vasculature and the cardiomediastinal silhouette are within normal limits. Posttraumatic deformity of the distal right clavicle related to remote trauma. IMPRESSION: No radiographic evidence of acute cardiopulmonary disease. Electronically Signed   By: Vinnie Langton M.D.   On: 05/27/2019 19:30   Ct Abdomen Pelvis W Contrast  Result Date: 05/27/2019 CLINICAL DATA:  Epigastric pain, nausea, vomiting EXAM: CT ABDOMEN AND PELVIS WITH CONTRAST TECHNIQUE:  Multidetector CT imaging of the abdomen and pelvis was performed using the standard protocol following bolus administration of intravenous contrast. CONTRAST:  131mL OMNIPAQUE IOHEXOL 300 MG/ML  SOLN COMPARISON:  06/10/2005 FINDINGS: Lower chest: Lung bases are clear. No effusions. Heart is normal size. Hepatobiliary: Diffuse low-density throughout the liver compatible with fatty infiltration. No focal abnormality. Gallbladder unremarkable. Pancreas: No focal abnormality or ductal dilatation. Spleen: No focal abnormality.  Normal size. Adrenals/Urinary Tract: No adrenal abnormality. No focal renal abnormality. No stones or hydronephrosis. Urinary  bladder is unremarkable. Stomach/Bowel: There are dilated fluid-filled small bowel loops in the abdomen and pelvis. Distal small bowel is decompressed. Findings compatible with partial small bowel obstruction. The transition point appears to be in the right mid abdomen, presumably related to adhesions. Stomach and large bowel unremarkable. Vascular/Lymphatic: No evidence of aneurysm or adenopathy. Reproductive: No visible focal abnormality. Other: No free fluid or free air. Musculoskeletal: No acute bony abnormality. IMPRESSION: Partial small bowel obstruction. Transition point noted in the right mid abdomen without obvious cause. Fatty infiltration of the liver. Electronically Signed   By: Rolm Baptise M.D.   On: 05/27/2019 22:36    Procedures Procedures (including critical care time)  Medications Ordered in ED Medications  sodium chloride flush (NS) 0.9 % injection 3 mL (has no administration in time range)  sodium chloride 0.9 % bolus 1,000 mL (1,000 mLs Intravenous New Bag/Given 05/27/19 2145)  ondansetron (ZOFRAN) injection 4 mg (4 mg Intravenous Given 05/27/19 2145)  morphine 4 MG/ML injection 4 mg (4 mg Intravenous Given 05/27/19 2146)  iohexol (OMNIPAQUE) 300 MG/ML solution 100 mL (100 mLs Intravenous Contrast Given 05/27/19 2213)     Initial Impression /  Assessment and Plan / ED Course  I have reviewed the triage vital signs and the nursing notes.  Pertinent labs & imaging results that were available during my care of the patient were reviewed by me and considered in my medical decision making (see chart for details).     Pt given IVFs and morphine/zofran.  The pt is feeling better.  CT shows partial sbo.  No hx of surgery.  Pt d/w Dr. Marlowe Sax ([facility]) who will admit.  Pt d/w Dr. Kieth Brightly (surgery).  He will put him on the list.  covid swab pending at admission.  Final Clinical Impressions(s) / ED Diagnoses   Final diagnoses:  Partial small bowel obstruction [facility])    ED Discharge Orders    None       Isla Pence, MD 05/27/19 2324

## 2019-05-28 ENCOUNTER — Encounter (HOSPITAL_COMMUNITY): Payer: Self-pay | Admitting: General Practice

## 2019-05-28 DIAGNOSIS — D72829 Elevated white blood cell count, unspecified: Secondary | ICD-10-CM

## 2019-05-28 DIAGNOSIS — N179 Acute kidney failure, unspecified: Secondary | ICD-10-CM

## 2019-05-28 DIAGNOSIS — Z114 Encounter for screening for human immunodeficiency virus [HIV]: Secondary | ICD-10-CM

## 2019-05-28 HISTORY — DX: Acute kidney failure, unspecified: N17.9

## 2019-05-28 LAB — CBC WITH DIFFERENTIAL/PLATELET
Abs Immature Granulocytes: 0.01 10*3/uL (ref 0.00–0.07)
Basophils Absolute: 0 10*3/uL (ref 0.0–0.1)
Basophils Relative: 0 %
Eosinophils Absolute: 0.1 10*3/uL (ref 0.0–0.5)
Eosinophils Relative: 1 %
HCT: 37.6 % — ABNORMAL LOW (ref 39.0–52.0)
Hemoglobin: 13.1 g/dL (ref 13.0–17.0)
Immature Granulocytes: 0 %
Lymphocytes Relative: 17 %
Lymphs Abs: 1.4 10*3/uL (ref 0.7–4.0)
MCH: 29.8 pg (ref 26.0–34.0)
MCHC: 34.8 g/dL (ref 30.0–36.0)
MCV: 85.5 fL (ref 80.0–100.0)
Monocytes Absolute: 0.8 10*3/uL (ref 0.1–1.0)
Monocytes Relative: 10 %
Neutro Abs: 5.8 10*3/uL (ref 1.7–7.7)
Neutrophils Relative %: 72 %
Platelets: 242 10*3/uL (ref 150–400)
RBC: 4.4 MIL/uL (ref 4.22–5.81)
RDW: 12 % (ref 11.5–15.5)
WBC: 8 10*3/uL (ref 4.0–10.5)
nRBC: 0 % (ref 0.0–0.2)

## 2019-05-28 LAB — URINALYSIS, ROUTINE W REFLEX MICROSCOPIC
Bacteria, UA: NONE SEEN
Bilirubin Urine: NEGATIVE
Glucose, UA: NEGATIVE mg/dL
Ketones, ur: NEGATIVE mg/dL
Leukocytes,Ua: NEGATIVE
Nitrite: NEGATIVE
Protein, ur: NEGATIVE mg/dL
Specific Gravity, Urine: 1.004 — ABNORMAL LOW (ref 1.005–1.030)
pH: 6 (ref 5.0–8.0)

## 2019-05-28 LAB — MAGNESIUM: Magnesium: 1.8 mg/dL (ref 1.7–2.4)

## 2019-05-28 LAB — HIV ANTIBODY (ROUTINE TESTING W REFLEX): HIV Screen 4th Generation wRfx: NONREACTIVE

## 2019-05-28 LAB — PHOSPHORUS: Phosphorus: 3.1 mg/dL (ref 2.5–4.6)

## 2019-05-28 LAB — SARS CORONAVIRUS 2 BY RT PCR (HOSPITAL ORDER, PERFORMED IN ~~LOC~~ HOSPITAL LAB): SARS Coronavirus 2: NEGATIVE

## 2019-05-28 MED ORDER — ONDANSETRON HCL 4 MG/2ML IJ SOLN: 4.0000 mg | Freq: Four times a day (QID) | INTRAMUSCULAR | Status: DC | PRN

## 2019-05-28 MED ORDER — DOCUSATE SODIUM 100 MG PO CAPS
100.0000 mg | ORAL_CAPSULE | Freq: Two times a day (BID) | ORAL | Status: DC
  Administered 2019-05-28: 100 mg via ORAL
  Filled 2019-05-28: qty 1

## 2019-05-28 MED ORDER — BISACODYL 10 MG RE SUPP
10.0000 mg | Freq: Every day | RECTAL | Status: DC | PRN
  Filled 2019-05-28: qty 1

## 2019-05-28 MED ORDER — MORPHINE SULFATE (PF) 2 MG/ML IV SOLN
1.0000 mg | INTRAVENOUS | Status: DC | PRN
  Administered 2019-05-28: 1 mg via INTRAVENOUS
  Filled 2019-05-28: qty 1

## 2019-05-28 MED ORDER — SODIUM CHLORIDE 0.9 % IV SOLN
INTRAVENOUS | Status: AC
  Administered 2019-05-28: 02:00:00 via INTRAVENOUS

## 2019-05-28 MED ORDER — ACETAMINOPHEN 650 MG RE SUPP: 650.0000 mg | Freq: Four times a day (QID) | RECTAL | Status: DC | PRN

## 2019-05-28 MED ORDER — HEPARIN SODIUM (PORCINE) 5000 UNIT/ML IJ SOLN
5000.0000 [IU] | Freq: Three times a day (TID) | INTRAMUSCULAR | Status: DC
  Administered 2019-05-28 – 2019-05-30 (×7): 5000 [IU] via SUBCUTANEOUS
  Filled 2019-05-28 (×6): qty 1

## 2019-05-28 MED ORDER — DEXTROSE-NACL 5-0.45 % IV SOLN: INTRAVENOUS | Status: DC

## 2019-05-28 MED ORDER — ACETAMINOPHEN 325 MG PO TABS
650.0000 mg | ORAL_TABLET | Freq: Four times a day (QID) | ORAL | Status: DC | PRN
  Administered 2019-05-28 (×2): 650 mg via ORAL
  Filled 2019-05-28 (×3): qty 2

## 2019-05-28 NOTE — H&P (Signed)
Reason for Consult: abdominal pain Referring Physician: [name] is an 48 y.o. male.  HPI: 48 yo male with 1 day of abdominal pain and vomiting. He vomited multiple times before coming in as well as 2 times in the ER. He also has had diarrhea more than 4 times. He was having pain in his epigastrium. The pain lessens after vomiting. Overnight the pain improved significantly and now he does not feel nauseated and is thirsty. He has never had abdominal surgery. He has never had similar episodes.  Past Medical History:  Diagnosis Date   Arthritis    Heart murmur    Hypertension    OSA (obstructive sleep apnea) 05/15/2018   Peyronie's disease     History reviewed. No pertinent surgical history.  Family History  Problem Relation Age of Onset   Hypertension Mother    Stroke Mother    Heart disease Mother    Hypertension Father    Stroke Father    Diabetes Father    Heart disease Father    Diabetes Brother    Lung cancer Paternal Grandmother     Social History:  reports that he quit smoking about 28 years ago. His smoking use included cigarettes. He has a 1.00 pack-year smoking history. He has never used smokeless tobacco. He reports previous drug use. Drug: Marijuana. He reports that he does not drink alcohol.  Allergies: No Known Allergies  Medications: I have reviewed the patient's current medications.  Results for orders placed or performed during the hospital encounter of 05/27/19 (from the past 48 hour(s))  Basic metabolic panel     Status: Abnormal   Collection Time: 05/27/19  7:12 PM  Result Value Ref Range   Sodium 137 135 - 145 mmol/L   Potassium 4.0 3.5 - 5.1 mmol/L   Chloride 99 98 - 111 mmol/L   CO2 23 22 - 32 mmol/L   Glucose, Bld 159 (H) 70 - 99 mg/dL   BUN 17 6 - 20 mg/dL   Creatinine, Ser 1.57 (H) 0.61 - 1.24 mg/dL   Calcium 9.8 8.9 - 10.3 mg/dL   GFR calc non Af Amer 52 (L) >60 mL/min   GFR calc Af Amer >60 >60  mL/min   Anion gap 15 5 - 15    Comment: Performed at [facility] [address] [place], [place] [address]  CBC     Status: Abnormal   Collection Time: 05/27/19  7:12 PM  Result Value Ref Range   WBC 26.3 (H) 4.0 - 10.5 K/uL   RBC 5.32 4.22 - 5.81 MIL/uL   Hemoglobin 16.2 13.0 - 17.0 g/dL   HCT 47.1 39.0 - 52.0 %   MCV 88.5 80.0 - 100.0 fL   MCH 30.5 26.0 - 34.0 pg   MCHC 34.4 30.0 - 36.0 g/dL   RDW 12.1 11.5 - 15.5 %   Platelets 318 150 - 400 K/uL   nRBC 0.0 0.0 - 0.2 %    Comment: Performed at [facility], [place] [address] [place], [place] [address]  Troponin I (High Sensitivity)     Status: None   Collection Time: 05/27/19  7:12 PM  Result Value Ref Range   Troponin I (High Sensitivity) <2 <18 ng/L    Comment: Performed at [facility] [address], [place], [place] [address]  Troponin I (High Sensitivity)     Status: None   Collection Time: 05/27/19  9:06 PM  Result Value Ref Range   Troponin I (High Sensitivity) <2 <18 ng/L    Comment: Performed at [facility] [address] [place], [place] [address]  Hepatic function panel     Status: Abnormal   Collection Time: 05/27/19  9:06 PM  Result Value Ref Range   Total Protein 8.1 6.5 - 8.1 g/dL   Albumin 4.5 3.5 - 5.0 g/dL   AST 25 15 - 41 U/L   ALT 26 0 - 44 U/L   Alkaline Phosphatase 57 38 - 126 U/L   Total Bilirubin 1.5 (H) 0.3 - 1.2 mg/dL   Bilirubin, Direct 0.3 (H) 0.0 - 0.2 mg/dL   Indirect Bilirubin 1.2 (H) 0.3 - 0.9 mg/dL    Comment: Performed at [facility] [address] [place], [place] [address]  Lipase, blood     Status: None   Collection Time: 05/27/19  9:06 PM  Result Value Ref Range   Lipase 29 11 - 51 U/L    Comment: Performed at [facility] [address] [place], [place] [address]  SARS Coronavirus 2 (CEPHEID - Performed in [facility] hospital lab), Hosp Order     Status: None   Collection Time: 05/28/19 12:10 AM   Specimen: Nasopharyngeal Swab    Result Value Ref Range   SARS Coronavirus 2 NEGATIVE NEGATIVE    Comment: (NOTE) If result is NEGATIVE SARS-CoV-2 target nucleic acids are NOT DETECTED. The SARS-CoV-2 RNA is generally detectable in upper and lower  respiratory specimens during the acute phase of infection. The lowest  concentration of SARS-CoV-2 viral copies this assay can detect is 250  copies / mL. A negative result does not preclude SARS-CoV-2 infection  and should not be used as the sole basis for treatment or other  patient management decisions.  A negative result may occur with  improper specimen collection / handling, submission of specimen other  than nasopharyngeal swab, presence of viral mutation(s) within the  areas targeted by this assay, and inadequate number of viral copies  (<250 copies / mL). A negative result must be combined with clinical  observations, patient history, and epidemiological information. If result is POSITIVE SARS-CoV-2 target nucleic acids are DETECTED. The SARS-CoV-2 RNA is generally detectable in upper and lower  respiratory specimens dur ing the acute phase of infection.  Positive  results are indicative of active infection with SARS-CoV-2.  Clinical  correlation with patient history and other diagnostic information is  necessary to determine patient infection status.  Positive results do  not rule out bacterial infection or co-infection with other viruses. If result is PRESUMPTIVE POSTIVE SARS-CoV-2 nucleic acids MAY BE PRESENT.   A presumptive positive result was obtained on the submitted specimen  and confirmed on repeat testing.  While 2019 novel coronavirus  (SARS-CoV-2) nucleic acids may be present in the submitted sample  additional confirmatory testing may be necessary for epidemiological  and / or clinical management purposes  to differentiate between  SARS-CoV-2 and other Sarbecovirus currently known to infect humans.  If clinically indicated additional testing with  an alternate test  methodology [phone]) is advised. The SARS-CoV-2 RNA is generally  detectable in upper and lower respiratory sp ecimens during the acute  phase of infection. The expected result is Negative. Fact Sheet for Patients:  StrictlyIdeas.no Fact Sheet for Healthcare Providers: BankingDealers.co.za This test is not yet approved or cleared by the [place] FDA and has been authorized for detection and/or diagnosis of SARS-CoV-2  by FDA under an Emergency Use Authorization (EUA).  This EUA will remain in effect (meaning this test can be used) for the duration of the COVID-19 declaration under Section 564(b)(1) of the Act, 21 U.S.C. section 360bbb-3(b)(1), unless the authorization is terminated or revoked sooner. Performed at [facility], [place] [address] [place], [place] [address]     Dg Chest 2 View  Result Date: 05/27/2019 CLINICAL DATA:  48 year old male with history of central chest pain and epigastric pain today. EXAM: CHEST - 2 VIEW COMPARISON:  No priors. FINDINGS: Lung volumes are normal. No consolidative airspace disease. No pleural effusions. No pneumothorax. No pulmonary nodule or mass noted. Pulmonary vasculature and the cardiomediastinal silhouette are within normal limits. Posttraumatic deformity of the distal right clavicle related to remote trauma. IMPRESSION: No radiographic evidence of acute cardiopulmonary disease. Electronically Signed   By: Vinnie Langton M.D.   On: 05/27/2019 19:30   Ct Abdomen Pelvis W Contrast  Result Date: 05/27/2019 CLINICAL DATA:  Epigastric pain, nausea, vomiting EXAM: CT ABDOMEN AND PELVIS WITH CONTRAST TECHNIQUE: Multidetector CT imaging of the abdomen and pelvis was performed using the standard protocol following bolus administration of intravenous contrast. CONTRAST:  116mL OMNIPAQUE IOHEXOL 300 MG/ML  SOLN COMPARISON:  06/10/2005 FINDINGS: Lower chest: Lung bases are clear. No  effusions. Heart is normal size. Hepatobiliary: Diffuse low-density throughout the liver compatible with fatty infiltration. No focal abnormality. Gallbladder unremarkable. Pancreas: No focal abnormality or ductal dilatation. Spleen: No focal abnormality.  Normal size. Adrenals/Urinary Tract: No adrenal abnormality. No focal renal abnormality. No stones or hydronephrosis. Urinary bladder is unremarkable. Stomach/Bowel: There are dilated fluid-filled small bowel loops in the abdomen and pelvis. Distal small bowel is decompressed. Findings compatible with partial small bowel obstruction. The transition point appears to be in the right mid abdomen, presumably related to adhesions. Stomach and large bowel unremarkable. Vascular/Lymphatic: No evidence of aneurysm or adenopathy. Reproductive: No visible focal abnormality. Other: No free fluid or free air. Musculoskeletal: No acute bony abnormality. IMPRESSION: Partial small bowel obstruction. Transition point noted in the right mid abdomen without obvious cause. Fatty infiltration of the liver. Electronically Signed   By: Rolm Baptise M.D.   On: 05/27/2019 22:36    ROS Blood pressure 106/83, pulse 94, temperature 98.3 F (36.8 C), temperature source Oral, resp. rate 18, height 6' (1.829 m), weight 104.3 kg, SpO2 100 %. Physical Exam    Assessment/Plan: 48 yo male with nausea, vomiting, epigastric pain, diarrhea, leukocytosis and CT scan with dilated loops of bowel. I think this is most consistent with enteritis but could be a bizarre partial small bowel obstruction -clears today -will follow  Arta Bruce Tanaka Gillen 05/28/2019, 6:58 AM

## 2019-05-28 NOTE — Progress Notes (Signed)
Patient has orders for placement of NGT , he states that the physician said to hold on inserting the tube until surgery evaluates him in the morning. Notified Dr Hilbert Bible with [facility] and she says if patient is not nauseated or vomiting we can hold up on inserting the tube .

## 2019-05-28 NOTE — ED Notes (Signed)
Pain is gone at present

## 2019-05-28 NOTE — Progress Notes (Signed)
Patient Demographics:    Edward Mccarty, is a 48 y.o. male, DOB - 06-09-1971, [id number]  Admit date - 05/27/2019   Admitting Physician Shela Leff, MD  Outpatient Primary MD for the patient is Pleas Koch, NP  LOS - 1   Chief Complaint  Patient presents with  . Chest Pain        Subjective:    Edward Mccarty today has no fevers,   No chest pain, with emesis and abdominal discomfort after clear liquid diet currently n.p.o.  Assessment  & Plan :    Principal Problem:   Partial small bowel obstruction (HCC) Active Problems:   Essential hypertension   Leukocytosis   AKI (acute kidney injury) ([place])   Encounter for screening for HIV  Brief summary 48 yo male with nausea, vomiting, epigastric pain, diarrhea, leukocytosis and CT scan with dilated loops of bowel admitted 05/27/2019 for above reasons  1) enteritis versus partial small bowel obstruction--- leukocytosis has resolved, lipase is not elevated, no fevers resolved surgical consult appreciated patient did not tolerate clear liquid diet intake well... Per surgical team, n.p.o. for now continue IV fluids  2)HTN--- hold lisinopril, may use IV hydralazine as needed  3)AKI--- creatinine is up to 1.57 with a baseline creatinine of 1.2-1.3, hydrate, avoid nephrotoxic agents, hold lisinopril  Disposition/Need for in-Hospital Stay- patient unable to be discharged at this time due to enteritis versus partial small bowel obstruction unable to tolerate oral intake requiring IV fluids  Code Status : Full  Family Communication:   NA (patient is alert, awake and coherent)   Disposition Plan  : home  Consults  :  Gen surgery  DVT Prophylaxis  :  - Heparin - SCDs   Lab Results  Component Value Date   PLT 242 05/28/2019    Inpatient Medications  Scheduled Meds: . docusate sodium  100 mg Oral BID  . heparin  5,000 Units  Subcutaneous Q8H   Continuous Infusions: PRN Meds:.acetaminophen **OR** acetaminophen, bisacodyl, morphine injection, ondansetron (ZOFRAN) IV    Anti-infectives (From admission, onward)   None        Objective:   Vitals:   05/28/19 0059 05/28/19 0400 05/28/19 0840 05/28/19 1614  BP: 134/85 106/83 129/86 (!) 129/91  Pulse: 100 94 100 83  Resp: 16 18 18 18   Temp: 99.2 F (37.3 C) 98.3 F (36.8 C) 98.3 F (36.8 C) 97.8 F (36.6 C)  TempSrc: Oral Oral Oral Oral  SpO2: 100% 100% 97% 96%  Weight:      Height:        Wt Readings from Last 3 Encounters:  05/27/19 104.3 kg  01/20/19 106.1 kg  04/28/18 110.4 kg     Intake/Output Summary (Last 24 hours) at 05/28/2019 1818 Last data filed at 05/28/2019 1523 Gross per 24 hour  Intake 3472 ml  Output -  Net 3472 ml     Physical Exam  Gen:- Awake Alert,  In no apparent distress  HEENT:- [place].AT, No sclera icterus Neck-Supple Neck,No JVD,.  Lungs-  CTAB , fair symmetrical air movement CV- S1, S2 normal, regular  Abd-diminished B.Sounds, Abd Soft, generalized abdominal tenderness, slightly distended Extremity/Skin:- No  edema, pedal pulses present  Psych-affect is appropriate, oriented x3 Neuro-no new focal deficits,  no tremors   Data Review:   Micro Results Recent Results (from the past 240 hour(s))  SARS Coronavirus 2 (CEPHEID - Performed in [facility] hospital lab), Hosp Order     Status: None   Collection Time: 05/28/19 12:10 AM   Specimen: Nasopharyngeal Swab  Result Value Ref Range Status   SARS Coronavirus 2 NEGATIVE NEGATIVE Final    Comment: (NOTE) If result is NEGATIVE SARS-CoV-2 target nucleic acids are NOT DETECTED. The SARS-CoV-2 RNA is generally detectable in upper and lower  respiratory specimens during the acute phase of infection. The lowest  concentration of SARS-CoV-2 viral copies this assay can detect is 250  copies / mL. A negative result does not preclude SARS-CoV-2 infection  and should not  be used as the sole basis for treatment or other  patient management decisions.  A negative result may occur with  improper specimen collection / handling, submission of specimen other  than nasopharyngeal swab, presence of viral mutation(s) within the  areas targeted by this assay, and inadequate number of viral copies  (<250 copies / mL). A negative result must be combined with clinical  observations, patient history, and epidemiological information. If result is POSITIVE SARS-CoV-2 target nucleic acids are DETECTED. The SARS-CoV-2 RNA is generally detectable in upper and lower  respiratory specimens dur ing the acute phase of infection.  Positive  results are indicative of active infection with SARS-CoV-2.  Clinical  correlation with patient history and other diagnostic information is  necessary to determine patient infection status.  Positive results do  not rule out bacterial infection or co-infection with other viruses. If result is PRESUMPTIVE POSTIVE SARS-CoV-2 nucleic acids MAY BE PRESENT.   A presumptive positive result was obtained on the submitted specimen  and confirmed on repeat testing.  While 2019 novel coronavirus  (SARS-CoV-2) nucleic acids may be present in the submitted sample  additional confirmatory testing may be necessary for epidemiological  and / or clinical management purposes  to differentiate between  SARS-CoV-2 and other Sarbecovirus currently known to infect humans.  If clinically indicated additional testing with an alternate test  methodology [phone]) is advised. The SARS-CoV-2 RNA is generally  detectable in upper and lower respiratory sp ecimens during the acute  phase of infection. The expected result is Negative. Fact Sheet for Patients:  StrictlyIdeas.no Fact Sheet for Healthcare Providers: BankingDealers.co.za This test is not yet approved or cleared by the [place] FDA and has been authorized  for detection and/or diagnosis of SARS-CoV-2 by FDA under an Emergency Use Authorization (EUA).  This EUA will remain in effect (meaning this test can be used) for the duration of the COVID-19 declaration under Section 564(b)(1) of the Act, 21 U.S.C. section 360bbb-3(b)(1), unless the authorization is terminated or revoked sooner. Performed at [facility], [place] [address], [place], [place] [address]     Radiology Reports Dg Chest 2 View  Result Date: 05/27/2019 CLINICAL DATA:  48 year old male with history of central chest pain and epigastric pain today. EXAM: CHEST - 2 VIEW COMPARISON:  No priors. FINDINGS: Lung volumes are normal. No consolidative airspace disease. No pleural effusions. No pneumothorax. No pulmonary nodule or mass noted. Pulmonary vasculature and the cardiomediastinal silhouette are within normal limits. Posttraumatic deformity of the distal right clavicle related to remote trauma. IMPRESSION: No radiographic evidence of acute cardiopulmonary disease. Electronically Signed   By: Vinnie Langton M.D.   On: 05/27/2019 19:30   Ct Abdomen Pelvis W Contrast  Result Date: 05/27/2019  CLINICAL DATA:  Epigastric pain, nausea, vomiting EXAM: CT ABDOMEN AND PELVIS WITH CONTRAST TECHNIQUE: Multidetector CT imaging of the abdomen and pelvis was performed using the standard protocol following bolus administration of intravenous contrast. CONTRAST:  141mL OMNIPAQUE IOHEXOL 300 MG/ML  SOLN COMPARISON:  06/10/2005 FINDINGS: Lower chest: Lung bases are clear. No effusions. Heart is normal size. Hepatobiliary: Diffuse low-density throughout the liver compatible with fatty infiltration. No focal abnormality. Gallbladder unremarkable. Pancreas: No focal abnormality or ductal dilatation. Spleen: No focal abnormality.  Normal size. Adrenals/Urinary Tract: No adrenal abnormality. No focal renal abnormality. No stones or hydronephrosis. Urinary bladder is unremarkable. Stomach/Bowel: There are  dilated fluid-filled small bowel loops in the abdomen and pelvis. Distal small bowel is decompressed. Findings compatible with partial small bowel obstruction. The transition point appears to be in the right mid abdomen, presumably related to adhesions. Stomach and large bowel unremarkable. Vascular/Lymphatic: No evidence of aneurysm or adenopathy. Reproductive: No visible focal abnormality. Other: No free fluid or free air. Musculoskeletal: No acute bony abnormality. IMPRESSION: Partial small bowel obstruction. Transition point noted in the right mid abdomen without obvious cause. Fatty infiltration of the liver. Electronically Signed   By: Rolm Baptise M.D.   On: 05/27/2019 22:36     CBC Recent Labs  Lab 05/27/19 1912 05/28/19 0659  WBC 26.3* 8.0  HGB 16.2 13.1  HCT 47.1 37.6*  PLT 318 242  MCV 88.5 85.5  MCH 30.5 29.8  MCHC 34.4 34.8  RDW 12.1 12.0  LYMPHSABS  --  1.4  MONOABS  --  0.8  EOSABS  --  0.1  BASOSABS  --  0.0    Chemistries  Recent Labs  Lab 05/27/19 1912 05/27/19 2106 05/28/19 0659  NA 137  --   --   K 4.0  --   --   CL 99  --   --   CO2 23  --   --   GLUCOSE 159*  --   --   BUN 17  --   --   CREATININE 1.57*  --   --   CALCIUM 9.8  --   --   MG  --   --  1.8  AST  --  25  --   ALT  --  26  --   ALKPHOS  --  57  --   BILITOT  --  1.5*  --    ------------------------------------------------------------------------------------------------------------------ No results for input(s): CHOL, HDL, LDLCALC, TRIG, CHOLHDL, LDLDIRECT in the last 72 hours.  Lab Results  Component Value Date   HGBA1C 5.6 01/21/2017   ------------------------------------------------------------------------------------------------------------------ No results for input(s): TSH, T4TOTAL, T3FREE, THYROIDAB in the last 72 hours.  Invalid input(s): FREET3 ------------------------------------------------------------------------------------------------------------------ No results for  input(s): VITAMINB12, FOLATE, FERRITIN, TIBC, IRON, RETICCTPCT in the last 72 hours.  Coagulation profile No results for input(s): INR, PROTIME in the last 168 hours.  No results for input(s): DDIMER in the last 72 hours.  Cardiac Enzymes No results for input(s): CKMB, TROPONINI, MYOGLOBIN in the last 168 hours.  Invalid input(s): CK ------------------------------------------------------------------------------------------------------------------ No results found for: BNP   Roxan Hockey M.D on 05/28/2019 at 6:18 PM  Go to [email or web address] - for contact info  [facility] - Office  [phone]

## 2019-05-29 ENCOUNTER — Inpatient Hospital Stay (HOSPITAL_COMMUNITY): Payer: Managed Care, Other (non HMO)

## 2019-05-29 LAB — CBC
HCT: 39.1 % (ref 39.0–52.0)
Hemoglobin: 13.7 g/dL (ref 13.0–17.0)
MCH: 30 pg (ref 26.0–34.0)
MCHC: 35 g/dL (ref 30.0–36.0)
MCV: 85.7 fL (ref 80.0–100.0)
Platelets: 209 10*3/uL (ref 150–400)
RBC: 4.56 MIL/uL (ref 4.22–5.81)
RDW: 11.7 % (ref 11.5–15.5)
WBC: 5.4 10*3/uL (ref 4.0–10.5)
nRBC: 0 % (ref 0.0–0.2)

## 2019-05-29 LAB — COMPREHENSIVE METABOLIC PANEL
ALT: 28 U/L (ref 0–44)
AST: 25 U/L (ref 15–41)
Albumin: 3.4 g/dL — ABNORMAL LOW (ref 3.5–5.0)
Alkaline Phosphatase: 46 U/L (ref 38–126)
Anion gap: 7 (ref 5–15)
BUN: 9 mg/dL (ref 6–20)
CO2: 27 mmol/L (ref 22–32)
Calcium: 8.5 mg/dL — ABNORMAL LOW (ref 8.9–10.3)
Chloride: 105 mmol/L (ref 98–111)
Creatinine, Ser: 1.4 mg/dL — ABNORMAL HIGH (ref 0.61–1.24)
GFR calc Af Amer: >60 mL/min (ref >60)
GFR calc non Af Amer: 60 mL/min — ABNORMAL LOW (ref >60)
Glucose, Bld: 120 mg/dL — ABNORMAL HIGH (ref 70–99)
Potassium: 4.2 mmol/L (ref 3.5–5.1)
Sodium: 139 mmol/L (ref 135–145)
Total Bilirubin: 1.3 mg/dL — ABNORMAL HIGH (ref 0.3–1.2)
Total Protein: 6.2 g/dL — ABNORMAL LOW (ref 6.5–8.1)

## 2019-05-29 NOTE — Progress Notes (Signed)
Patient Demographics:    Edward Mccarty, is a 48 y.o. male, DOB - 11/21/1971, BSW:[id number]  Admit date - 05/27/2019   Admitting Physician Shela Leff, MD  Outpatient Primary MD for the patient is Pleas Koch, NP  LOS - 2   Chief Complaint  Patient presents with   Chest Pain        Subjective:    Edward Mccarty today has no fevers,   No chest pain, had to be kept n.p.o. yesterday because he did not tolerate clear liquid diet, tolerating liquid diet okay today  Assessment  & Plan :    Principal Problem:   Partial small bowel obstruction ([place]) Active Problems:   Essential hypertension   Leukocytosis   AKI (acute kidney injury) ([place])   Encounter for screening for HIV  Brief summary 48 yo male with nausea, vomiting, epigastric pain, diarrhea, leukocytosis and CT scan with dilated loops of bowel admitted 05/27/2019 for above reasons  A/p 1)Enteritis Versus Partial Small Bowel Obstruction--- leukocytosis has resolved, lipase is not elevated, no fevers, had to be kept n.p.o. yesterday because he did not tolerate clear liquid diet, tolerating liquid diet okay today, will advance diet slowly-- surgical consult appreciated  -no IV fluids, patient had loose stools  2)HTN--- hold lisinopril, may use IV hydralazine as needed  3)AKI--- due to dehydration in the setting of diarrhea and poor oral intake, creatinine is up to down to 1.4 from 1.57 with a baseline creatinine of 1.2-1.3, hydrate, avoid nephrotoxic agents, hold lisinopril  Disposition/Need for in-Hospital Stay- patient unable to be discharged at this time due to enteritis versus partial small bowel obstruction  requiring IV fluids until oral intake is more reliable  Code Status : Full  Family Communication:   NA (patient is alert, awake and coherent)   Disposition Plan  : home  Consults  :  Gen surgery  DVT Prophylaxis  :  -  Heparin - SCDs   Lab Results  Component Value Date   PLT 209 05/29/2019    Inpatient Medications  Scheduled Meds:  heparin  5,000 Units Subcutaneous Q8H   Continuous Infusions:  dextrose 5 % and 0.45% NaCl     PRN Meds:.acetaminophen **OR** acetaminophen, bisacodyl, morphine injection, ondansetron (ZOFRAN) IV    Anti-infectives (From admission, onward)   None        Objective:   Vitals:   05/28/19 2005 05/29/19 0407 05/29/19 0852 05/29/19 1657  BP: (!) 142/98 (!) 133/98 122/88 135/87  Pulse: 83 72 76 79  Resp: 17 18 16 17   Temp:  (!) 97.5 F (36.4 C) 98.4 F (36.9 C) 98.2 F (36.8 C)  TempSrc:  Oral Oral Oral  SpO2: 99% 98% 97% 97%  Weight:      Height:        Wt Readings from Last 3 Encounters:  05/27/19 104.3 kg  01/20/19 106.1 kg  04/28/18 110.4 kg     Intake/Output Summary (Last 24 hours) at 05/29/2019 1748 Last data filed at 05/29/2019 0800 Gross per 24 hour  Intake 60 ml  Output 500 ml  Net -440 ml     Physical Exam  Gen:- Awake Alert,  In no apparent distress  HEENT:- [place].AT, No sclera icterus Neck-Supple Neck,No JVD,.  Lungs-  CTAB , fair symmetrical air movement CV- S1, S2 normal, regular  Abd-diminished B.Sounds, Abd Soft, improving abdominal tenderness, less distended Extremity/Skin:- No  edema, pedal pulses present  Psych-affect is appropriate, oriented x3 Neuro-no new focal deficits, no tremors   Data Review:   Micro Results Recent Results (from the past 240 hour(s))  SARS Coronavirus 2 (CEPHEID - Performed in [place] hospital lab), Hosp Order     Status: None   Collection Time: 05/28/19 12:10 AM   Specimen: Nasopharyngeal Swab  Result Value Ref Range Status   SARS Coronavirus 2 NEGATIVE NEGATIVE Final    Comment: (NOTE) If result is NEGATIVE SARS-CoV-2 target nucleic acids are NOT DETECTED. The SARS-CoV-2 RNA is generally detectable in upper and lower  respiratory specimens during the acute phase of infection. The lowest   concentration of SARS-CoV-2 viral copies this assay can detect is 250  copies / mL. A negative result does not preclude SARS-CoV-2 infection  and should not be used as the sole basis for treatment or other  patient management decisions.  A negative result may occur with  improper specimen collection / handling, submission of specimen other  than nasopharyngeal swab, presence of viral mutation(s) within the  areas targeted by this assay, and inadequate number of viral copies  (<250 copies / mL). A negative result must be combined with clinical  observations, patient history, and epidemiological information. If result is POSITIVE SARS-CoV-2 target nucleic acids are DETECTED. The SARS-CoV-2 RNA is generally detectable in upper and lower  respiratory specimens dur ing the acute phase of infection.  Positive  results are indicative of active infection with SARS-CoV-2.  Clinical  correlation with patient history and other diagnostic information is  necessary to determine patient infection status.  Positive results do  not rule out bacterial infection or co-infection with other viruses. If result is PRESUMPTIVE POSTIVE SARS-CoV-2 nucleic acids MAY BE PRESENT.   A presumptive positive result was obtained on the submitted specimen  and confirmed on repeat testing.  While 2019 novel coronavirus  (SARS-CoV-2) nucleic acids may be present in the submitted sample  additional confirmatory testing may be necessary for epidemiological  and / or clinical management purposes  to differentiate between  SARS-CoV-2 and other Sarbecovirus currently known to infect humans.  If clinically indicated additional testing with an alternate test  methodology [phone]) is advised. The SARS-CoV-2 RNA is generally  detectable in upper and lower respiratory sp ecimens during the acute  phase of infection. The expected result is Negative. Fact Sheet for Patients:  StrictlyIdeas.no Fact Sheet  for Healthcare Providers: BankingDealers.co.za This test is not yet approved or cleared by the [place] FDA and has been authorized for detection and/or diagnosis of SARS-CoV-2 by FDA under an Emergency Use Authorization (EUA).  This EUA will remain in effect (meaning this test can be used) for the duration of the COVID-19 declaration under Section 564(b)(1) of the Act, 21 U.S.C. section 360bbb-3(b)(1), unless the authorization is terminated or revoked sooner. Performed at [facility], [place] [address] [place], [place] [address]     Radiology Reports Dg Chest 2 View  Result Date: 05/27/2019 CLINICAL DATA:  48 year old male with history of central chest pain and epigastric pain today. EXAM: CHEST - 2 VIEW COMPARISON:  No priors. FINDINGS: Lung volumes are normal. No consolidative airspace disease. No pleural effusions. No pneumothorax. No pulmonary nodule or mass noted. Pulmonary vasculature and the cardiomediastinal silhouette are within normal limits. Posttraumatic deformity of the  distal right clavicle related to remote trauma. IMPRESSION: No radiographic evidence of acute cardiopulmonary disease. Electronically Signed   By: Vinnie Langton M.D.   On: 05/27/2019 19:30   Ct Abdomen Pelvis W Contrast  Result Date: 05/27/2019 CLINICAL DATA:  Epigastric pain, nausea, vomiting EXAM: CT ABDOMEN AND PELVIS WITH CONTRAST TECHNIQUE: Multidetector CT imaging of the abdomen and pelvis was performed using the standard protocol following bolus administration of intravenous contrast. CONTRAST:  142mL OMNIPAQUE IOHEXOL 300 MG/ML  SOLN COMPARISON:  06/10/2005 FINDINGS: Lower chest: Lung bases are clear. No effusions. Heart is normal size. Hepatobiliary: Diffuse low-density throughout the liver compatible with fatty infiltration. No focal abnormality. Gallbladder unremarkable. Pancreas: No focal abnormality or ductal dilatation. Spleen: No focal abnormality.  Normal size.  Adrenals/Urinary Tract: No adrenal abnormality. No focal renal abnormality. No stones or hydronephrosis. Urinary bladder is unremarkable. Stomach/Bowel: There are dilated fluid-filled small bowel loops in the abdomen and pelvis. Distal small bowel is decompressed. Findings compatible with partial small bowel obstruction. The transition point appears to be in the right mid abdomen, presumably related to adhesions. Stomach and large bowel unremarkable. Vascular/Lymphatic: No evidence of aneurysm or adenopathy. Reproductive: No visible focal abnormality. Other: No free fluid or free air. Musculoskeletal: No acute bony abnormality. IMPRESSION: Partial small bowel obstruction. Transition point noted in the right mid abdomen without obvious cause. Fatty infiltration of the liver. Electronically Signed   By: Rolm Baptise M.D.   On: 05/27/2019 22:36   Dg Abd Portable 1v  Result Date: 05/29/2019 CLINICAL DATA:  Small-bowel obstruction, abdominal discomfort EXAM: PORTABLE ABDOMEN - 1 VIEW COMPARISON:  05/27/2019 FINDINGS: Mild-to-moderate small bowel distension in the mid abdomen measuring up to 4.1 cm in the left abdomen. Air present throughout the colon. Appearance remains nonspecific for ileus or mild partial obstruction. No abnormal calcifications or acute osseous finding. IMPRESSION: Persistent similar small bowel distension with air present in the colon. Electronically Signed   By: Jerilynn Mages.  Shick M.D.   On: 05/29/2019 10:49     CBC Recent Labs  Lab 05/27/19 1912 05/28/19 0659 05/29/19 0816  WBC 26.3* 8.0 5.4  HGB 16.2 13.1 13.7  HCT 47.1 37.6* 39.1  PLT 318 242 209  MCV 88.5 85.5 85.7  MCH 30.5 29.8 30.0  MCHC 34.4 34.8 35.0  RDW 12.1 12.0 11.7  LYMPHSABS  --  1.4  --   MONOABS  --  0.8  --   EOSABS  --  0.1  --   BASOSABS  --  0.0  --     Chemistries  Recent Labs  Lab 05/27/19 1912 05/27/19 2106 05/28/19 0659 05/29/19 0816  NA 137  --   --  139  K 4.0  --   --  4.2  CL 99  --   --  105    CO2 23  --   --  27  GLUCOSE 159*  --   --  120*  BUN 17  --   --  9  CREATININE 1.57*  --   --  1.40*  CALCIUM 9.8  --   --  8.5*  MG  --   --  1.8  --   AST  --  25  --  25  ALT  --  26  --  28  ALKPHOS  --  57  --  46  BILITOT  --  1.5*  --  1.3*   ------------------------------------------------------------------------------------------------------------------ No results for input(s): CHOL, HDL, LDLCALC, TRIG, CHOLHDL, LDLDIRECT in the last  72 hours.  Lab Results  Component Value Date   HGBA1C 5.6 01/21/2017   ------------------------------------------------------------------------------------------------------------------ No results for input(s): TSH, T4TOTAL, T3FREE, THYROIDAB in the last 72 hours.  Invalid input(s): FREET3 ------------------------------------------------------------------------------------------------------------------ No results for input(s): VITAMINB12, FOLATE, FERRITIN, TIBC, IRON, RETICCTPCT in the last 72 hours.  Coagulation profile No results for input(s): INR, PROTIME in the last 168 hours.  No results for input(s): DDIMER in the last 72 hours.  Cardiac Enzymes No results for input(s): CKMB, TROPONINI, MYOGLOBIN in the last 168 hours.  Invalid input(s): CK ------------------------------------------------------------------------------------------------------------------ No results found for: BNP   Roxan Hockey M.D on 05/29/2019 at 5:48 PM  Go to [email or web address] - for contact info  [facility] - Office  [phone]

## 2019-05-29 NOTE — Plan of Care (Signed)
  Problem: Elimination: Goal: Will not experience complications related to bowel motility Outcome: Progressing   Problem: Pain Managment: Goal: General experience of comfort will improve Outcome: Progressing   Problem: Nutrition: Goal: Adequate nutrition will be maintained Outcome: Progressing   

## 2019-05-29 NOTE — Progress Notes (Signed)
Patient ID: Edward Mccarty, male   DOB: Dec 23, 1970, 48 y.o.   MRN: [id number] [facility] Surgery Progress Note:   * No surgery found *  Subjective: Mental status is clear;  Feeling better after BM Objective: Vital signs in last 24 hours: Temp:  [97.5 F (36.4 C)-98.4 F (36.9 C)] 98.4 F (36.9 C) (07/03 0852) Pulse Rate:  [72-83] 76 (07/03 0852) Resp:  [16-18] 16 (07/03 0852) BP: (122-142)/(88-98) 122/88 (07/03 0852) SpO2:  [96 %-99 %] 97 % (07/03 0852)  Intake/Output from previous day: 07/02 0701 - 07/03 0700 In: 2532 [P.O.:540; I.V.:1992] Out: 500 [Urine:500] Intake/Output this shift: No intake/output data recorded.  Physical Exam: Work of breathing is not labored.  Abdomen is flatter today and he is passing flatus  Lab Results:  Results for orders placed or performed during the hospital encounter of 05/27/19 (from the past 48 hour(s))  Basic metabolic panel     Status: Abnormal   Collection Time: 05/27/19  7:12 PM  Result Value Ref Range   Sodium 137 135 - 145 mmol/L   Potassium 4.0 3.5 - 5.1 mmol/L   Chloride 99 98 - 111 mmol/L   CO2 23 22 - 32 mmol/L   Glucose, Bld 159 (H) 70 - 99 mg/dL   BUN 17 6 - 20 mg/dL   Creatinine, Ser 1.57 (H) 0.61 - 1.24 mg/dL   Calcium 9.8 8.9 - 10.3 mg/dL   GFR calc non Af Amer 52 (L) >60 mL/min   GFR calc Af Amer >60 >60 mL/min   Anion gap 15 5 - 15    Comment: Performed at [facility], [place] [address], [place], [place] [address]  CBC     Status: Abnormal   Collection Time: 05/27/19  7:12 PM  Result Value Ref Range   WBC 26.3 (H) 4.0 - 10.5 K/uL   RBC 5.32 4.22 - 5.81 MIL/uL   Hemoglobin 16.2 13.0 - 17.0 g/dL   HCT 47.1 39.0 - 52.0 %   MCV 88.5 80.0 - 100.0 fL   MCH 30.5 26.0 - 34.0 pg   MCHC 34.4 30.0 - 36.0 g/dL   RDW 12.1 11.5 - 15.5 %   Platelets 318 150 - 400 K/uL   nRBC 0.0 0.0 - 0.2 %    Comment: Performed at [facility], [place] [address], [place], [place] [address]  Troponin I (High Sensitivity)      Status: None   Collection Time: 05/27/19  7:12 PM  Result Value Ref Range   Troponin I (High Sensitivity) <2 <18 ng/L    Comment: Performed at [facility] [address], [place], [place] [address]  Troponin I (High Sensitivity)     Status: None   Collection Time: 05/27/19  9:06 PM  Result Value Ref Range   Troponin I (High Sensitivity) <2 <18 ng/L    Comment: Performed at [facility] [address] [place], [place] [address]  Hepatic function panel     Status: Abnormal   Collection Time: 05/27/19  9:06 PM  Result Value Ref Range   Total Protein 8.1 6.5 - 8.1 g/dL   Albumin 4.5 3.5 - 5.0 g/dL   AST 25 15 - 41 U/L   ALT 26 0 - 44 U/L   Alkaline Phosphatase 57 38 - 126 U/L   Total Bilirubin 1.5 (H) 0.3 - 1.2 mg/dL   Bilirubin, Direct 0.3 (H) 0.0 - 0.2 mg/dL   Indirect Bilirubin 1.2 (H) 0.3 -  0.9 mg/dL    Comment: Performed at [facility], [place] [address], [place], [place] [address]  Lipase, blood     Status: None   Collection Time: 05/27/19  9:06 PM  Result Value Ref Range   Lipase 29 11 - 51 U/L    Comment: Performed at [facility] [address], [place], [place] [address]  SARS Coronavirus 2 (CEPHEID - Performed in [facility] hospital lab), Hosp Order     Status: None   Collection Time: 05/28/19 12:10 AM   Specimen: Nasopharyngeal Swab  Result Value Ref Range   SARS Coronavirus 2 NEGATIVE NEGATIVE    Comment: (NOTE) If result is NEGATIVE SARS-CoV-2 target nucleic acids are NOT DETECTED. The SARS-CoV-2 RNA is generally detectable in upper and lower  respiratory specimens during the acute phase of infection. The lowest  concentration of SARS-CoV-2 viral copies this assay can detect is 250  copies / mL. A negative result does not preclude SARS-CoV-2 infection  and should not be used as the sole basis for treatment or other  patient management decisions.  A negative result may occur with  improper specimen collection / handling, submission of  specimen other  than nasopharyngeal swab, presence of viral mutation(s) within the  areas targeted by this assay, and inadequate number of viral copies  (<250 copies / mL). A negative result must be combined with clinical  observations, patient history, and epidemiological information. If result is POSITIVE SARS-CoV-2 target nucleic acids are DETECTED. The SARS-CoV-2 RNA is generally detectable in upper and lower  respiratory specimens dur ing the acute phase of infection.  Positive  results are indicative of active infection with SARS-CoV-2.  Clinical  correlation with patient history and other diagnostic information is  necessary to determine patient infection status.  Positive results do  not rule out bacterial infection or co-infection with other viruses. If result is PRESUMPTIVE POSTIVE SARS-CoV-2 nucleic acids MAY BE PRESENT.   A presumptive positive result was obtained on the submitted specimen  and confirmed on repeat testing.  While 2019 novel coronavirus  (SARS-CoV-2) nucleic acids may be present in the submitted sample  additional confirmatory testing may be necessary for epidemiological  and / or clinical management purposes  to differentiate between  SARS-CoV-2 and other Sarbecovirus currently known to infect humans.  If clinically indicated additional testing with an alternate test  methodology [phone]) is advised. The SARS-CoV-2 RNA is generally  detectable in upper and lower respiratory sp ecimens during the acute  phase of infection. The expected result is Negative. Fact Sheet for Patients:  StrictlyIdeas.no Fact Sheet for Healthcare Providers: BankingDealers.co.za This test is not yet approved or cleared by the [place] FDA and has been authorized for detection and/or diagnosis of SARS-CoV-2 by FDA under an Emergency Use Authorization (EUA).  This EUA will remain in effect (meaning this test can be used) for the  duration of the COVID-19 declaration under Section 564(b)(1) of the Act, 21 U.S.C. section 360bbb-3(b)(1), unless the authorization is terminated or revoked sooner. Performed at [facility], [place] [address] [place], [place] [address]   HIV antibody (Routine Testing)     Status: None   Collection Time: 05/28/19  6:59 AM  Result Value Ref Range   HIV Screen 4th Generation wRfx Non Reactive Non Reactive    Comment: (NOTE) Performed At: [facility] [place], [place] [id number] Rush Farmer MD VF:[id number]   CBC with Differential/Platelet  Status: Abnormal   Collection Time: 05/28/19  6:59 AM  Result Value Ref Range   WBC 8.0 4.0 - 10.5 K/uL   RBC 4.40 4.22 - 5.81 MIL/uL   Hemoglobin 13.1 13.0 - 17.0 g/dL   HCT 37.6 (L) 39.0 - 52.0 %   MCV 85.5 80.0 - 100.0 fL   MCH 29.8 26.0 - 34.0 pg   MCHC 34.8 30.0 - 36.0 g/dL   RDW 12.0 11.5 - 15.5 %   Platelets 242 150 - 400 K/uL   nRBC 0.0 0.0 - 0.2 %   Neutrophils Relative % 72 %   Neutro Abs 5.8 1.7 - 7.7 K/uL   Lymphocytes Relative 17 %   Lymphs Abs 1.4 0.7 - 4.0 K/uL   Monocytes Relative 10 %   Monocytes Absolute 0.8 0.1 - 1.0 K/uL   Eosinophils Relative 1 %   Eosinophils Absolute 0.1 0.0 - 0.5 K/uL   Basophils Relative 0 %   Basophils Absolute 0.0 0.0 - 0.1 K/uL   Immature Granulocytes 0 %   Abs Immature Granulocytes 0.01 0.00 - 0.07 K/uL    Comment: Performed at [facility], [address], [place], [place] [address]  Magnesium     Status: None   Collection Time: 05/28/19  6:59 AM  Result Value Ref Range   Magnesium 1.8 1.7 - 2.4 mg/dL    Comment: Performed at [facility] [address] [place] [address] [place], [place] [address]  Phosphorus     Status: None   Collection Time: 05/28/19  6:59 AM  Result Value Ref Range   Phosphorus 3.1 2.5 - 4.6 mg/dL    Comment: Performed at [facility] [address] [place], [place] [address]  Urinalysis, Routine w reflex microscopic     Status:  Abnormal   Collection Time: 05/28/19 11:03 PM  Result Value Ref Range   Color, Urine STRAW (A) YELLOW   APPearance CLEAR CLEAR   Specific Gravity, Urine 1.004 (L) 1.005 - 1.030   pH 6.0 5.0 - 8.0   Glucose, UA NEGATIVE NEGATIVE mg/dL   Hgb urine dipstick MODERATE (A) NEGATIVE   Bilirubin Urine NEGATIVE NEGATIVE   Ketones, ur NEGATIVE NEGATIVE mg/dL   Protein, ur NEGATIVE NEGATIVE mg/dL   Nitrite NEGATIVE NEGATIVE   Leukocytes,Ua NEGATIVE NEGATIVE   RBC / HPF 0-5 0 - 5 RBC/hpf   WBC, UA 0-5 0 - 5 WBC/hpf   Bacteria, UA NONE SEEN NONE SEEN    Comment: Performed at [facility] [address], [place], [place] [address]  CBC     Status: None   Collection Time: 05/29/19  8:16 AM  Result Value Ref Range   WBC 5.4 4.0 - 10.5 K/uL   RBC 4.56 4.22 - 5.81 MIL/uL   Hemoglobin 13.7 13.0 - 17.0 g/dL   HCT 39.1 39.0 - 52.0 %   MCV 85.7 80.0 - 100.0 fL   MCH 30.0 26.0 - 34.0 pg   MCHC 35.0 30.0 - 36.0 g/dL   RDW 11.7 11.5 - 15.5 %   Platelets 209 150 - 400 K/uL   nRBC 0.0 0.0 - 0.2 %    Comment: Performed at [facility], [place] [address] [place], [place] [address]  Comprehensive metabolic panel     Status: Abnormal   Collection Time: 05/29/19  8:16 AM  Result Value Ref Range   Sodium 139 135 - 145 mmol/L   Potassium 4.2 3.5 - 5.1 mmol/L   Chloride  105 98 - 111 mmol/L   CO2 27 22 - 32 mmol/L   Glucose, Bld 120 (H) 70 - 99 mg/dL   BUN 9 6 - 20 mg/dL   Creatinine, Ser 1.40 (H) 0.61 - 1.24 mg/dL   Calcium 8.5 (L) 8.9 - 10.3 mg/dL   Total Protein 6.2 (L) 6.5 - 8.1 g/dL   Albumin 3.4 (L) 3.5 - 5.0 g/dL   AST 25 15 - 41 U/L   ALT 28 0 - 44 U/L   Alkaline Phosphatase 46 38 - 126 U/L   Total Bilirubin 1.3 (H) 0.3 - 1.2 mg/dL   GFR calc non Af Amer 60 (L) >60 mL/min   GFR calc Af Amer >60 >60 mL/min   Anion gap 7 5 - 15    Comment: Performed at [facility] [address] [place], [place] [address]    Radiology/Results: Dg Chest 2 View  Result Date:  05/27/2019 CLINICAL DATA:  48 year old male with history of central chest pain and epigastric pain today. EXAM: CHEST - 2 VIEW COMPARISON:  No priors. FINDINGS: Lung volumes are normal. No consolidative airspace disease. No pleural effusions. No pneumothorax. No pulmonary nodule or mass noted. Pulmonary vasculature and the cardiomediastinal silhouette are within normal limits. Posttraumatic deformity of the distal right clavicle related to remote trauma. IMPRESSION: No radiographic evidence of acute cardiopulmonary disease. Electronically Signed   By: Vinnie Langton M.D.   On: 05/27/2019 19:30   Ct Abdomen Pelvis W Contrast  Result Date: 05/27/2019 CLINICAL DATA:  Epigastric pain, nausea, vomiting EXAM: CT ABDOMEN AND PELVIS WITH CONTRAST TECHNIQUE: Multidetector CT imaging of the abdomen and pelvis was performed using the standard protocol following bolus administration of intravenous contrast. CONTRAST:  154mL OMNIPAQUE IOHEXOL 300 MG/ML  SOLN COMPARISON:  06/10/2005 FINDINGS: Lower chest: Lung bases are clear. No effusions. Heart is normal size. Hepatobiliary: Diffuse low-density throughout the liver compatible with fatty infiltration. No focal abnormality. Gallbladder unremarkable. Pancreas: No focal abnormality or ductal dilatation. Spleen: No focal abnormality.  Normal size. Adrenals/Urinary Tract: No adrenal abnormality. No focal renal abnormality. No stones or hydronephrosis. Urinary bladder is unremarkable. Stomach/Bowel: There are dilated fluid-filled small bowel loops in the abdomen and pelvis. Distal small bowel is decompressed. Findings compatible with partial small bowel obstruction. The transition point appears to be in the right mid abdomen, presumably related to adhesions. Stomach and large bowel unremarkable. Vascular/Lymphatic: No evidence of aneurysm or adenopathy. Reproductive: No visible focal abnormality. Other: No free fluid or free air. Musculoskeletal: No acute bony abnormality.  IMPRESSION: Partial small bowel obstruction. Transition point noted in the right mid abdomen without obvious cause. Fatty infiltration of the liver. Electronically Signed   By: Rolm Baptise M.D.   On: 05/27/2019 22:36   Dg Abd Portable 1v  Result Date: 05/29/2019 CLINICAL DATA:  Small-bowel obstruction, abdominal discomfort EXAM: PORTABLE ABDOMEN - 1 VIEW COMPARISON:  05/27/2019 FINDINGS: Mild-to-moderate small bowel distension in the mid abdomen measuring up to 4.1 cm in the left abdomen. Air present throughout the colon. Appearance remains nonspecific for ileus or mild partial obstruction. No abnormal calcifications or acute osseous finding. IMPRESSION: Persistent similar small bowel distension with air present in the colon. Electronically Signed   By: Jerilynn Mages.  Shick M.D.   On: 05/29/2019 10:49    Anti-infectives: Anti-infectives (From admission, onward)   None      Assessment/Plan: Problem List: Patient Active Problem List   Diagnosis Date Noted  . Leukocytosis 05/28/2019  . AKI (acute kidney injury) (  [place]) 05/28/2019  . Encounter for screening for HIV 05/28/2019  . Partial small bowel obstruction ([place]) 05/27/2019  . Erectile dysfunction 01/20/2019  . OSA (obstructive sleep apnea) 05/15/2018  . Allergic rhinitis 01/22/2018  . Essential hypertension 01/21/2017  . Preventative health care 01/21/2017    Bowel function is returning * No surgery found *    LOS: 2 days   Matt B. Hassell Done, MD, [facility] [phone] beeper [phone]  05/29/2019 11:06 AM

## 2019-05-30 DIAGNOSIS — I10 Essential (primary) hypertension: Secondary | ICD-10-CM

## 2019-05-30 DIAGNOSIS — K56600 Partial intestinal obstruction, unspecified as to cause: Principal | ICD-10-CM

## 2019-05-30 DIAGNOSIS — N179 Acute kidney failure, unspecified: Secondary | ICD-10-CM

## 2019-05-30 LAB — BASIC METABOLIC PANEL
Anion gap: 10 (ref 5–15)
BUN: 7 mg/dL (ref 6–20)
CO2: 24 mmol/L (ref 22–32)
Calcium: 8.4 mg/dL — ABNORMAL LOW (ref 8.9–10.3)
Chloride: 104 mmol/L (ref 98–111)
Creatinine, Ser: 1.42 mg/dL — ABNORMAL HIGH (ref 0.61–1.24)
GFR calc Af Amer: >60 mL/min (ref >60)
GFR calc non Af Amer: 59 mL/min — ABNORMAL LOW (ref >60)
Glucose, Bld: 109 mg/dL — ABNORMAL HIGH (ref 70–99)
Potassium: 3.8 mmol/L (ref 3.5–5.1)
Sodium: 138 mmol/L (ref 135–145)

## 2019-05-30 MED ORDER — LISINOPRIL 20 MG PO TABS: 20.0000 mg | ORAL_TABLET | Freq: Every day | ORAL | 3 refills | Status: DC

## 2019-05-30 NOTE — Discharge Summary (Signed)
Physician Discharge Summary  Edward Mccarty XID:[id number] DOB: [birth year] DOA: 05/27/2019  PCP: Pleas Koch, NP  Admit date: 05/27/2019 Discharge date: 05/30/2019  Admitted From: Home Disposition: Home  Recommendations for Outpatient Follow-up:  1. Follow up with PCP in 1-2 weeks 2. Please obtain CBC/BMP/Mag at follow up 3. Please follow up on the following pending results: None  Home Health: None Equipment/Devices: None  Discharge Condition: Stable CODE STATUS: Full code  Hospital Course: 48 year old male with history of hypertension presenting with nausea, vomiting and diarrhea and found to have possible partial SBO and subsequent AKI. Managed in collaboration with general surgery.  Treated with bowel rest, IV fluid and pain medication.  GI symptoms and SBO resolved.  AKI improved.  Tolerated soft diet.  Cleared by general surgery for discharge.  See individual problem list below for more.  Discharge Diagnoses:  Partial SBO/possible gastroenteritis: GI symptoms resolved.  Had a formed bowel movement.  Tolerated diet last night and this morning.  Cleared for discharge by general surgery.  AKI: Likely a combination of prerenal insult due to GI loss and ATN due to lisinopril.  AKI improved. -Advised to hold lisinopril for 3 to 4 days -Recheck BMP at follow-up  Hypertension: Normotensive -Recommended holding lisinopril for 3 to 4 days and resuming.  Discharge Instructions  Discharge Instructions    Call MD for:  difficulty breathing, headache or visual disturbances   Complete by: As directed    Call MD for:  persistant dizziness or light-headedness   Complete by: As directed    Call MD for:  persistant nausea and vomiting   Complete by: As directed    Call MD for:  severe uncontrolled pain   Complete by: As directed    Call MD for:  temperature >100.4   Complete by: As directed    Diet - low sodium heart healthy   Complete by: As directed    Discharge  instructions   Complete by: As directed    It has been a pleasure taking care of you! You were admitted with acute kidney injury likely due to some bowel inflammation/obstruction.  Your kidney improved with improvement in your diarrhea.  We strongly recommend avoiding any over-the-counter pain medication other than plain Tylenol.  We also recommend holding off your lisinopril for 3 to 4 days to allow your kidney to recover.  Please review your new medication list and the directions before you take your medications. Please call your primary care office as soon as possible to schedule hospital follow-up visit in 1 to 2 weeks.  Take care,   Increase activity slowly   Complete by: As directed      Allergies as of 05/30/2019   No Known Allergies     Medication List    STOP taking these medications   ibuprofen 200 MG tablet Commonly known as: ADVIL   naproxen sodium 220 MG tablet Commonly known as: ALEVE     TAKE these medications   fluticasone 50 MCG/ACT nasal spray Commonly known as: FLONASE PLACE 1 SPRAY INTO BOTH NOSTRILS 2 TIMES DAILY AS NEEDED FOR ALLERGIES OR RHINITIS. What changed:   how much to take  how to take this  when to take this  reasons to take this  additional instructions   lisinopril 20 MG tablet Commonly known as: ZESTRIL Take 1 tablet (20 mg total) by mouth daily. For blood pressure. Start taking on: June 02, 2019 What changed: These instructions start on June 02, 2019. If  you are unsure what to do until then, ask your doctor or other care provider.   loratadine 10 MG tablet Commonly known as: CLARITIN Take 1 tablet (10 mg total) by mouth daily. For allergies.   sildenafil 50 MG tablet Commonly known as: Viagra Take 1/2 to 1 tablet by mouth 30 minutes prior to intercourse.      Follow-up Information    Pleas Koch, NP. Schedule an appointment as soon as possible for a visit in 1 week(s).   Specialty: Internal Medicine Contact  information: [place] [id number] [phone]           Consultations:  General surgery  Procedures/Studies:  2D Echo: None  Dg Chest 2 View  Result Date: 05/27/2019 CLINICAL DATA:  48 year old male with history of central chest pain and epigastric pain today. EXAM: CHEST - 2 VIEW COMPARISON:  No priors. FINDINGS: Lung volumes are normal. No consolidative airspace disease. No pleural effusions. No pneumothorax. No pulmonary nodule or mass noted. Pulmonary vasculature and the cardiomediastinal silhouette are within normal limits. Posttraumatic deformity of the distal right clavicle related to remote trauma. IMPRESSION: No radiographic evidence of acute cardiopulmonary disease. Electronically Signed   By: Vinnie Langton M.D.   On: 05/27/2019 19:30   Ct Abdomen Pelvis W Contrast  Result Date: 05/27/2019 CLINICAL DATA:  Epigastric pain, nausea, vomiting EXAM: CT ABDOMEN AND PELVIS WITH CONTRAST TECHNIQUE: Multidetector CT imaging of the abdomen and pelvis was performed using the standard protocol following bolus administration of intravenous contrast. CONTRAST:  19mL OMNIPAQUE IOHEXOL 300 MG/ML  SOLN COMPARISON:  06/10/2005 FINDINGS: Lower chest: Lung bases are clear. No effusions. Heart is normal size. Hepatobiliary: Diffuse low-density throughout the liver compatible with fatty infiltration. No focal abnormality. Gallbladder unremarkable. Pancreas: No focal abnormality or ductal dilatation. Spleen: No focal abnormality.  Normal size. Adrenals/Urinary Tract: No adrenal abnormality. No focal renal abnormality. No stones or hydronephrosis. Urinary bladder is unremarkable. Stomach/Bowel: There are dilated fluid-filled small bowel loops in the abdomen and pelvis. Distal small bowel is decompressed. Findings compatible with partial small bowel obstruction. The transition point appears to be in the right mid abdomen, presumably related to adhesions. Stomach and large bowel  unremarkable. Vascular/Lymphatic: No evidence of aneurysm or adenopathy. Reproductive: No visible focal abnormality. Other: No free fluid or free air. Musculoskeletal: No acute bony abnormality. IMPRESSION: Partial small bowel obstruction. Transition point noted in the right mid abdomen without obvious cause. Fatty infiltration of the liver. Electronically Signed   By: Rolm Baptise M.D.   On: 05/27/2019 22:36   Dg Abd Portable 1v  Result Date: 05/29/2019 CLINICAL DATA:  Small-bowel obstruction, abdominal discomfort EXAM: PORTABLE ABDOMEN - 1 VIEW COMPARISON:  05/27/2019 FINDINGS: Mild-to-moderate small bowel distension in the mid abdomen measuring up to 4.1 cm in the left abdomen. Air present throughout the colon. Appearance remains nonspecific for ileus or mild partial obstruction. No abnormal calcifications or acute osseous finding. IMPRESSION: Persistent similar small bowel distension with air present in the colon. Electronically Signed   By: Jerilynn Mages.  Shick M.D.   On: 05/29/2019 10:49      Subjective: No major events overnight of this morning.  Last bowel movement last night.  Formed.  Nonbloody.  Tolerated soft diet well.  No further emesis.  No abdominal pain.  Feels ready to go home.   Discharge Exam: Vitals:   05/30/19 0351 05/30/19 0859  BP: 116/74 (!) 149/91  Pulse: 72 88  Resp: 18   Temp: 98.1  F (36.7 C) 97.6 F (36.4 C)  SpO2: 99% 99%    GENERAL: No acute distress.  Appears well.  HEENT: MMM.  Vision and hearing grossly intact.  NECK: Supple.  No JVD.  LUNGS:  No IWOB. Good air movement bilaterally. HEART:  RRR. Heart sounds normal.  ABD: Bowel sounds present. Soft. Non tender.  MSK/EXT:  Moves all extremities. No apparent deformity. No edema bilaterally. SKIN: no apparent skin lesion or wound NEURO: Awake, alert and oriented appropriately.  No gross deficit.  PSYCH: Calm. Normal affect.   The results of significant diagnostics from this hospitalization (including imaging,  microbiology, ancillary and laboratory) are listed below for reference.     Microbiology: Recent Results (from the past 240 hour(s))  SARS Coronavirus 2 (CEPHEID - Performed in [place] hospital lab), Hosp Order     Status: None   Collection Time: 05/28/19 12:10 AM   Specimen: Nasopharyngeal Swab  Result Value Ref Range Status   SARS Coronavirus 2 NEGATIVE NEGATIVE Final    Comment: (NOTE) If result is NEGATIVE SARS-CoV-2 target nucleic acids are NOT DETECTED. The SARS-CoV-2 RNA is generally detectable in upper and lower  respiratory specimens during the acute phase of infection. The lowest  concentration of SARS-CoV-2 viral copies this assay can detect is 250  copies / mL. A negative result does not preclude SARS-CoV-2 infection  and should not be used as the sole basis for treatment or other  patient management decisions.  A negative result may occur with  improper specimen collection / handling, submission of specimen other  than nasopharyngeal swab, presence of viral mutation(s) within the  areas targeted by this assay, and inadequate number of viral copies  (<250 copies / mL). A negative result must be combined with clinical  observations, patient history, and epidemiological information. If result is POSITIVE SARS-CoV-2 target nucleic acids are DETECTED. The SARS-CoV-2 RNA is generally detectable in upper and lower  respiratory specimens dur ing the acute phase of infection.  Positive  results are indicative of active infection with SARS-CoV-2.  Clinical  correlation with patient history and other diagnostic information is  necessary to determine patient infection status.  Positive results do  not rule out bacterial infection or co-infection with other viruses. If result is PRESUMPTIVE POSTIVE SARS-CoV-2 nucleic acids MAY BE PRESENT.   A presumptive positive result was obtained on the submitted specimen  and confirmed on repeat testing.  While 2019 novel coronavirus    (SARS-CoV-2) nucleic acids may be present in the submitted sample  additional confirmatory testing may be necessary for epidemiological  and / or clinical management purposes  to differentiate between  SARS-CoV-2 and other Sarbecovirus currently known to infect humans.  If clinically indicated additional testing with an alternate test  methodology [phone]) is advised. The SARS-CoV-2 RNA is generally  detectable in upper and lower respiratory sp ecimens during the acute  phase of infection. The expected result is Negative. Fact Sheet for Patients:  StrictlyIdeas.no Fact Sheet for Healthcare Providers: BankingDealers.co.za This test is not yet approved or cleared by the [place] FDA and has been authorized for detection and/or diagnosis of SARS-CoV-2 by FDA under an Emergency Use Authorization (EUA).  This EUA will remain in effect (meaning this test can be used) for the duration of the COVID-19 declaration under Section 564(b)(1) of the Act, 21 U.S.C. section 360bbb-3(b)(1), unless the authorization is terminated or revoked sooner. Performed at [facility], [place] [address], [place], [place] [address]  Labs: BNP (last 3 results) No results for input(s): BNP in the last 8760 hours. Basic Metabolic Panel: Recent Labs  Lab 05/27/19 1912 05/28/19 0659 05/29/19 0816 05/30/19 0417  NA 137  --  139 138  K 4.0  --  4.2 3.8  CL 99  --  105 104  CO2 23  --  27 24  GLUCOSE 159*  --  120* 109*  BUN 17  --  9 7  CREATININE 1.57*  --  1.40* 1.42*  CALCIUM 9.8  --  8.5* 8.4*  MG  --  1.8  --   --   PHOS  --  3.1  --   --    Liver Function Tests: Recent Labs  Lab 05/27/19 2106 05/29/19 0816  AST 25 25  ALT 26 28  ALKPHOS 57 46  BILITOT 1.5* 1.3*  PROT 8.1 6.2*  ALBUMIN 4.5 3.4*   Recent Labs  Lab 05/27/19 2106  LIPASE 29   No results for input(s): AMMONIA in the last 168 hours. CBC: Recent Labs  Lab  05/27/19 1912 05/28/19 0659 05/29/19 0816  WBC 26.3* 8.0 5.4  NEUTROABS  --  5.8  --   HGB 16.2 13.1 13.7  HCT 47.1 37.6* 39.1  MCV 88.5 85.5 85.7  PLT 318 242 209   Cardiac Enzymes: No results for input(s): CKTOTAL, CKMB, CKMBINDEX, TROPONINI in the last 168 hours. BNP: Invalid input(s): POCBNP CBG: No results for input(s): GLUCAP in the last 168 hours. D-Dimer No results for input(s): DDIMER in the last 72 hours. Hgb A1c No results for input(s): HGBA1C in the last 72 hours. Lipid Profile No results for input(s): CHOL, HDL, LDLCALC, TRIG, CHOLHDL, LDLDIRECT in the last 72 hours. Thyroid function studies No results for input(s): TSH, T4TOTAL, T3FREE, THYROIDAB in the last 72 hours.  Invalid input(s): FREET3 Anemia work up No results for input(s): VITAMINB12, FOLATE, FERRITIN, TIBC, IRON, RETICCTPCT in the last 72 hours. Urinalysis    Component Value Date/Time   COLORURINE STRAW (A) 05/28/2019 2303   APPEARANCEUR CLEAR 05/28/2019 2303   LABSPEC 1.004 (L) 05/28/2019 2303   PHURINE 6.0 05/28/2019 2303   GLUCOSEU NEGATIVE 05/28/2019 2303   HGBUR MODERATE (A) 05/28/2019 2303   BILIRUBINUR NEGATIVE 05/28/2019 2303   KETONESUR NEGATIVE 05/28/2019 2303   PROTEINUR NEGATIVE 05/28/2019 2303   NITRITE NEGATIVE 05/28/2019 2303   LEUKOCYTESUR NEGATIVE 05/28/2019 2303   Sepsis Labs Invalid input(s): PROCALCITONIN,  WBC,  LACTICIDVEN   Time coordinating discharge: 25 minutes  SIGNED:  Mercy Riding, MD  [facility] 05/30/2019, 10:57 AM  If 7PM-7AM, please contact night-coverage [email or web address] Password [email or web address]

## 2019-05-30 NOTE — Plan of Care (Signed)
  Problem: Nutrition: Goal: Adequate nutrition will be maintained Outcome: Progressing   Problem: Elimination: Goal: Will not experience complications related to bowel motility Outcome: Progressing   

## 2019-05-30 NOTE — Progress Notes (Signed)
Patient ID: Edward Mccarty, male   DOB: 15-Feb-1971, 48 y.o.   MRN: [id number] [facility] Progress Note:   * No surgery found *  Subjective: Mental status is clear and talkative Objective: Vital signs in last 24 hours: Temp:  [97.6 F (36.4 C)-98.7 F (37.1 C)] 97.6 F (36.4 C) (07/04 0859) Pulse Rate:  [72-88] 88 (07/04 0859) Resp:  [17-18] 18 (07/04 0351) BP: (116-149)/(74-91) 149/91 (07/04 0859) SpO2:  [97 %-99 %] 99 % (07/04 0859)  Intake/Output from previous day: 07/03 0701 - 07/04 0700 In: 240 [P.O.:240] Out: -  Intake/Output this shift: No intake/output data recorded.  Physical Exam: Work of breathing is not labored;  He is having good BMs and I think that his enteritis has cleared.    Lab Results:  Results for orders placed or performed during the hospital encounter of 05/27/19 (from the past 48 hour(s))  Urinalysis, Routine w reflex microscopic     Status: Abnormal   Collection Time: 05/28/19 11:03 PM  Result Value Ref Range   Color, Urine STRAW (A) YELLOW   APPearance CLEAR CLEAR   Specific Gravity, Urine 1.004 (L) 1.005 - 1.030   pH 6.0 5.0 - 8.0   Glucose, UA NEGATIVE NEGATIVE mg/dL   Hgb urine dipstick MODERATE (A) NEGATIVE   Bilirubin Urine NEGATIVE NEGATIVE   Ketones, ur NEGATIVE NEGATIVE mg/dL   Protein, ur NEGATIVE NEGATIVE mg/dL   Nitrite NEGATIVE NEGATIVE   Leukocytes,Ua NEGATIVE NEGATIVE   RBC / HPF 0-5 0 - 5 RBC/hpf   WBC, UA 0-5 0 - 5 WBC/hpf   Bacteria, UA NONE SEEN NONE SEEN    Comment: Performed at [facility], [address], [place], [place] [address]  CBC     Status: None   Collection Time: 05/29/19  8:16 AM  Result Value Ref Range   WBC 5.4 4.0 - 10.5 K/uL   RBC 4.56 4.22 - 5.81 MIL/uL   Hemoglobin 13.7 13.0 - 17.0 g/dL   HCT 39.1 39.0 - 52.0 %   MCV 85.7 80.0 - 100.0 fL   MCH 30.0 26.0 - 34.0 pg   MCHC 35.0 30.0 - 36.0 g/dL   RDW 11.7 11.5 - 15.5 %   Platelets 209 150 - 400 K/uL   nRBC 0.0 0.0 - 0.2 %   Comment: Performed at [facility], [place] [address] [place], [place] [address]  Comprehensive metabolic panel     Status: Abnormal   Collection Time: 05/29/19  8:16 AM  Result Value Ref Range   Sodium 139 135 - 145 mmol/L   Potassium 4.2 3.5 - 5.1 mmol/L   Chloride 105 98 - 111 mmol/L   CO2 27 22 - 32 mmol/L   Glucose, Bld 120 (H) 70 - 99 mg/dL   BUN 9 6 - 20 mg/dL   Creatinine, Ser 1.40 (H) 0.61 - 1.24 mg/dL   Calcium 8.5 (L) 8.9 - 10.3 mg/dL   Total Protein 6.2 (L) 6.5 - 8.1 g/dL   Albumin 3.4 (L) 3.5 - 5.0 g/dL   AST 25 15 - 41 U/L   ALT 28 0 - 44 U/L   Alkaline Phosphatase 46 38 - 126 U/L   Total Bilirubin 1.3 (H) 0.3 - 1.2 mg/dL   GFR calc non Af Amer 60 (L) >60 mL/min   GFR calc Af Amer >60 >60 mL/min   Anion gap 7 5 - 15    Comment: Performed at [facility], [place]  [address] [place], [place] [address]  Basic metabolic panel     Status: Abnormal   Collection Time: 05/30/19  4:17 AM  Result Value Ref Range   Sodium 138 135 - 145 mmol/L   Potassium 3.8 3.5 - 5.1 mmol/L   Chloride 104 98 - 111 mmol/L   CO2 24 22 - 32 mmol/L   Glucose, Bld 109 (H) 70 - 99 mg/dL   BUN 7 6 - 20 mg/dL   Creatinine, Ser 1.42 (H) 0.61 - 1.24 mg/dL   Calcium 8.4 (L) 8.9 - 10.3 mg/dL   GFR calc non Af Amer 59 (L) >60 mL/min   GFR calc Af Amer >60 >60 mL/min   Anion gap 10 5 - 15    Comment: Performed at [place] [address], [place], [place] [address]    Radiology/Results: Dg Abd Portable 1v  Result Date: 05/29/2019 CLINICAL DATA:  Small-bowel obstruction, abdominal discomfort EXAM: PORTABLE ABDOMEN - 1 VIEW COMPARISON:  05/27/2019 FINDINGS: Mild-to-moderate small bowel distension in the mid abdomen measuring up to 4.1 cm in the left abdomen. Air present throughout the colon. Appearance remains nonspecific for ileus or mild partial obstruction. No abnormal calcifications or acute osseous finding. IMPRESSION: Persistent similar small bowel distension with air present in  the colon. Electronically Signed   By: Jerilynn Mages.  Shick M.D.   On: 05/29/2019 10:49    Anti-infectives: Anti-infectives (From admission, onward)   None      Assessment/Plan: Problem List: Patient Active Problem List   Diagnosis Date Noted  . Leukocytosis 05/28/2019  . AKI (acute kidney injury) ([place]) 05/28/2019  . Encounter for screening for HIV 05/28/2019  . Partial small bowel obstruction ([place]) 05/27/2019  . Erectile dysfunction 01/20/2019  . OSA (obstructive sleep apnea) 05/15/2018  . Allergic rhinitis 01/22/2018  . Essential hypertension 01/21/2017  . Preventative health care 01/21/2017    OK for discharge from surgery standpoint * No surgery found *    LOS: 3 days   Matt B. Hassell Done, MD, [facility] [phone] beeper [phone]  05/30/2019 11:07 AM

## 2019-06-02 ENCOUNTER — Telehealth: Payer: Self-pay

## 2019-06-02 NOTE — Telephone Encounter (Signed)
Noted  

## 2019-06-02 NOTE — Telephone Encounter (Signed)
Transition Care Management Follow-up Telephone Call  Admit date: 05/27/2019 Discharge date: 05/30/2019 Discharge Diagnoses:  Partial SBO/possible gastroenteritis   How have you been since you were released from the hospital? "I'm doing good". Patient denies abdominal pain, N/V. Does have occasional diarrhea, able to tolerate food/fluids.    Do you understand why you were in the hospital? yes   Do you understand the discharge instructions? yes   Where were you discharged to? Home.    Items Reviewed:  Medications reviewed: yes  Allergies reviewed: yes  Dietary changes reviewed: yes  Referrals reviewed: yes   Functional Questionnaire:   Activities of Daily Living (ADLs):   He states they are independent in the following: ambulation, bathing and hygiene, feeding, continence, grooming, toileting and dressing States they require assistance with the following: None.    Any transportation issues/concerns?: no   Any patient concerns? no   Confirmed importance and date/time of follow-up visits scheduled yes  Provider Appointment booked with PCP 06/09/2019 via [facility].   Confirmed with patient if condition begins to worsen call PCP or go to the ER.  Patient was given the office number and encouraged to call back with question or concerns.  : yes

## 2019-06-09 ENCOUNTER — Ambulatory Visit (INDEPENDENT_AMBULATORY_CARE_PROVIDER_SITE_OTHER): Payer: Managed Care, Other (non HMO) | Admitting: Primary Care

## 2019-06-09 ENCOUNTER — Encounter: Payer: Self-pay | Admitting: Primary Care

## 2019-06-09 VITALS — Wt 234.0 lb

## 2019-06-09 DIAGNOSIS — N179 Acute kidney failure, unspecified: Secondary | ICD-10-CM

## 2019-06-09 DIAGNOSIS — Z09 Encounter for follow-up examination after completed treatment for conditions other than malignant neoplasm: Secondary | ICD-10-CM

## 2019-06-09 DIAGNOSIS — K56600 Partial intestinal obstruction, unspecified as to cause: Secondary | ICD-10-CM | POA: Diagnosis not present

## 2019-06-09 NOTE — Assessment & Plan Note (Signed)
Diagnosed with SBO during hospital stay in early July. Likely from poor diet and lack of water consumption. Today doing much better, encouraged to continue to work on diet and proper water consumption. Repeat BMP pending given AKI during hospital stay. Return precautions provided.  Hospital labs, imaging, notes reviewed.

## 2019-06-09 NOTE — Progress Notes (Signed)
Subjective:    Patient ID: Edward Mccarty, male    DOB: March 20, 1971, 48 y.o.   MRN: [id number]  HPI  Virtual Visit via Video Note  I connected with Edward Mccarty on 06/09/19 at 11:20 AM EDT by a video enabled telemedicine application and verified that I am speaking with the correct person using two identifiers.  Location: Patient: Home Provider: Office   I discussed the limitations of evaluation and management by telemedicine and the availability of in person appointments. The patient expressed understanding and agreed to proceed.  History of Present Illness:   Edward Mccarty is a 48 year old male who presents today for hospital follow up.  He presented to [facility] on 05/27/19 with a chief complaint of epigastric abdominal pain with nausea, vomiting, diarrhea. This began suddenly after eating lunch that day.   During his stay in the ED he was noted to be tachycardic with tachypnea, hypertensive. Work up in the ED with WBC count of 26.3, elevated creatinine of 1.5, normal lipase and LFT's, normal troponin. ECG was overall unremarkable. CT abdomen/pelvis showed a partial bowel obstruction without obvious cause. Given CT scan results and HPI he was admitted for further evaluation.  During his hospital stay he was evaluated by general surgery who suspected enteritis with near resolve of SBO. He was treated with pain medication, never underwent NG tube placement. His symptoms gradually improved and his SBO resolved with bowel rest and IV fluids. He was discharged home on 05/30/19 with recommendations for PCP follow up.  Since his hospital stay he's feeling well. Denies abdominal pain, nausea, vomiting, constipation, diarrhea. He endorses eating a poor diet which mostly consisted of take out food/greasy food for about one month prior. Also not drinking much water. He's since corrected those habits.   Observations/Objective:  Alert and oriented. Appears well, not sickly. No distress. Speaking  in complete sentences.   Assessment and Plan:  Diagnosed with SBO during hospital stay in early July. Likely from poor diet and lack of water consumption. Today doing much better, encouraged to continue to work on diet and proper water consumption. Repeat BMP pending given AKI during hospital stay. Return precautions provided.  Hospital labs, imaging, notes reviewed.   Follow Up Instructions:  Call the main line to schedule the lab only appointment as discussed.  It's important to improve your diet by reducing consumption of fast food, fried food, processed snack foods, sugary drinks. Increase consumption of fresh vegetables and fruits, whole grains, water.  Ensure you are drinking 64 ounces of water daily.  It was a pleasure to see you today!    I discussed the assessment and treatment plan with the patient. The patient was provided an opportunity to ask questions and all were answered. The patient agreed with the plan and demonstrated an understanding of the instructions.   The patient was advised to call back or seek an in-person evaluation if the symptoms worsen or if the condition fails to improve as anticipated.    Pleas Koch, NP    Review of Systems  Respiratory: Negative for shortness of breath.   Cardiovascular: Negative for chest pain.  Gastrointestinal: Negative for abdominal pain, constipation, diarrhea, nausea and vomiting.       Past Medical History:  Diagnosis Date  . Arthritis   . Heart murmur   . Hypertension   . OSA (obstructive sleep apnea) 05/15/2018  . Peyronie's disease      Social History   Socioeconomic History  .  Marital status: Married    Spouse name: Not on file  . Number of children: Not on file  . Years of education: Not on file  . Highest education level: Not on file  Occupational History  . Not on file  Social Needs  . Financial resource strain: Not on file  . Food insecurity    Worry: Not on file    Inability: Not  on file  . Transportation needs    Medical: Not on file    Non-medical: Not on file  Tobacco Use  . Smoking status: Former Smoker    Packs/day: 0.25    Years: 4.00    Pack years: 1.00    Types: Cigarettes    Quit date: 05/21/1991    Years since quitting: 28.0  . Smokeless tobacco: Never Used  Substance and Sexual Activity  . Alcohol use: No  . Drug use: Not Currently    Types: Marijuana  . Sexual activity: Not on file  Lifestyle  . Physical activity    Days per week: Not on file    Minutes per session: Not on file  . Stress: Not on file  Relationships  . Social Herbalist on phone: Not on file    Gets together: Not on file    Attends religious service: Not on file    Active member of club or organization: Not on file    Attends meetings of clubs or organizations: Not on file    Relationship status: Not on file  . Intimate partner violence    Fear of current or ex partner: Not on file    Emotionally abused: Not on file    Physically abused: Not on file    Forced sexual activity: Not on file  Other Topics Concern  . Not on file  Social History Narrative   Married.   3 children.   Works as an Radio broadcast assistant.   Enjoys working out, sleeping, relaxing.     Past Surgical History:  Procedure Laterality Date  . MULTIPLE TOOTH EXTRACTIONS      Family History  Problem Relation Age of Onset  . Hypertension Mother   . Stroke Mother   . Heart disease Mother   . Hypertension Father   . Stroke Father   . Diabetes Father   . Heart disease Father   . Diabetes Brother   . Lung cancer Paternal Grandmother     No Known Allergies  Current Outpatient Medications on File Prior to Visit  Medication Sig Dispense Refill  . fluticasone (FLONASE) 50 MCG/ACT nasal spray PLACE 1 SPRAY INTO BOTH NOSTRILS 2 TIMES DAILY AS NEEDED FOR ALLERGIES OR RHINITIS. (Patient taking differently: Place 2 sprays into both nostrils daily as needed for allergies. ) 16 g 2  . lisinopril  (ZESTRIL) 20 MG tablet Take 1 tablet (20 mg total) by mouth daily. For blood pressure. 90 tablet 3  . loratadine (CLARITIN) 10 MG tablet Take 1 tablet (10 mg total) by mouth daily. For allergies. 90 tablet 3  . sildenafil (VIAGRA) 50 MG tablet Take 1/2 to 1 tablet by mouth 30 minutes prior to intercourse. 10 tablet 0   No current facility-administered medications on file prior to visit.     Wt 234 lb (106.1 kg)   BMI 31.74 kg/m    Objective:   Physical Exam  Constitutional: He is oriented to person, place, and time. He appears well-nourished.  Respiratory: Effort normal.  Neurological: He is alert and oriented  to person, place, and time.  Psychiatric: He has a normal mood and affect.           Assessment & Plan:

## 2019-06-09 NOTE — Assessment & Plan Note (Signed)
Diagnosed during hospital stay. Suspect this was due to a combination of poor water intake and SBO.  Repeat BMP pending.

## 2019-06-09 NOTE — Patient Instructions (Signed)
Call the main line to schedule the lab only appointment as discussed.  It's important to improve your diet by reducing consumption of fast food, fried food, processed snack foods, sugary drinks. Increase consumption of fresh vegetables and fruits, whole grains, water.  Ensure you are drinking 64 ounces of water daily.  It was a pleasure to see you today!

## 2019-06-10 ENCOUNTER — Other Ambulatory Visit: Payer: Self-pay | Admitting: Primary Care

## 2019-06-10 DIAGNOSIS — J309 Allergic rhinitis, unspecified: Secondary | ICD-10-CM

## 2019-06-12 ENCOUNTER — Other Ambulatory Visit (INDEPENDENT_AMBULATORY_CARE_PROVIDER_SITE_OTHER): Payer: Managed Care, Other (non HMO)

## 2019-06-12 ENCOUNTER — Other Ambulatory Visit: Payer: Self-pay | Admitting: Primary Care

## 2019-06-12 DIAGNOSIS — N179 Acute kidney failure, unspecified: Secondary | ICD-10-CM

## 2019-06-12 DIAGNOSIS — R739 Hyperglycemia, unspecified: Secondary | ICD-10-CM

## 2019-06-12 LAB — BASIC METABOLIC PANEL
BUN: 15 mg/dL (ref 6–23)
CO2: 28 mEq/L (ref 19–32)
Calcium: 8.6 mg/dL (ref 8.4–10.5)
Chloride: 104 mEq/L (ref 96–112)
Creatinine, Ser: 1.17 mg/dL (ref 0.40–1.50)
GFR: 66.87 mL/min (ref >60.00)
Glucose, Bld: 131 mg/dL — ABNORMAL HIGH (ref 70–99)
Potassium: 3.6 mEq/L (ref 3.5–5.1)
Sodium: 139 mEq/L (ref 135–145)

## 2019-11-30 IMAGING — CR PORTABLE ABDOMEN - 1 VIEW
1 series · 1 of 1 positions shown · non-contrast
Comparison: 05/27/2019

CLINICAL DATA: Small-bowel obstruction, abdominal discomfort

EXAM:
PORTABLE ABDOMEN - 1 VIEW

[AP]
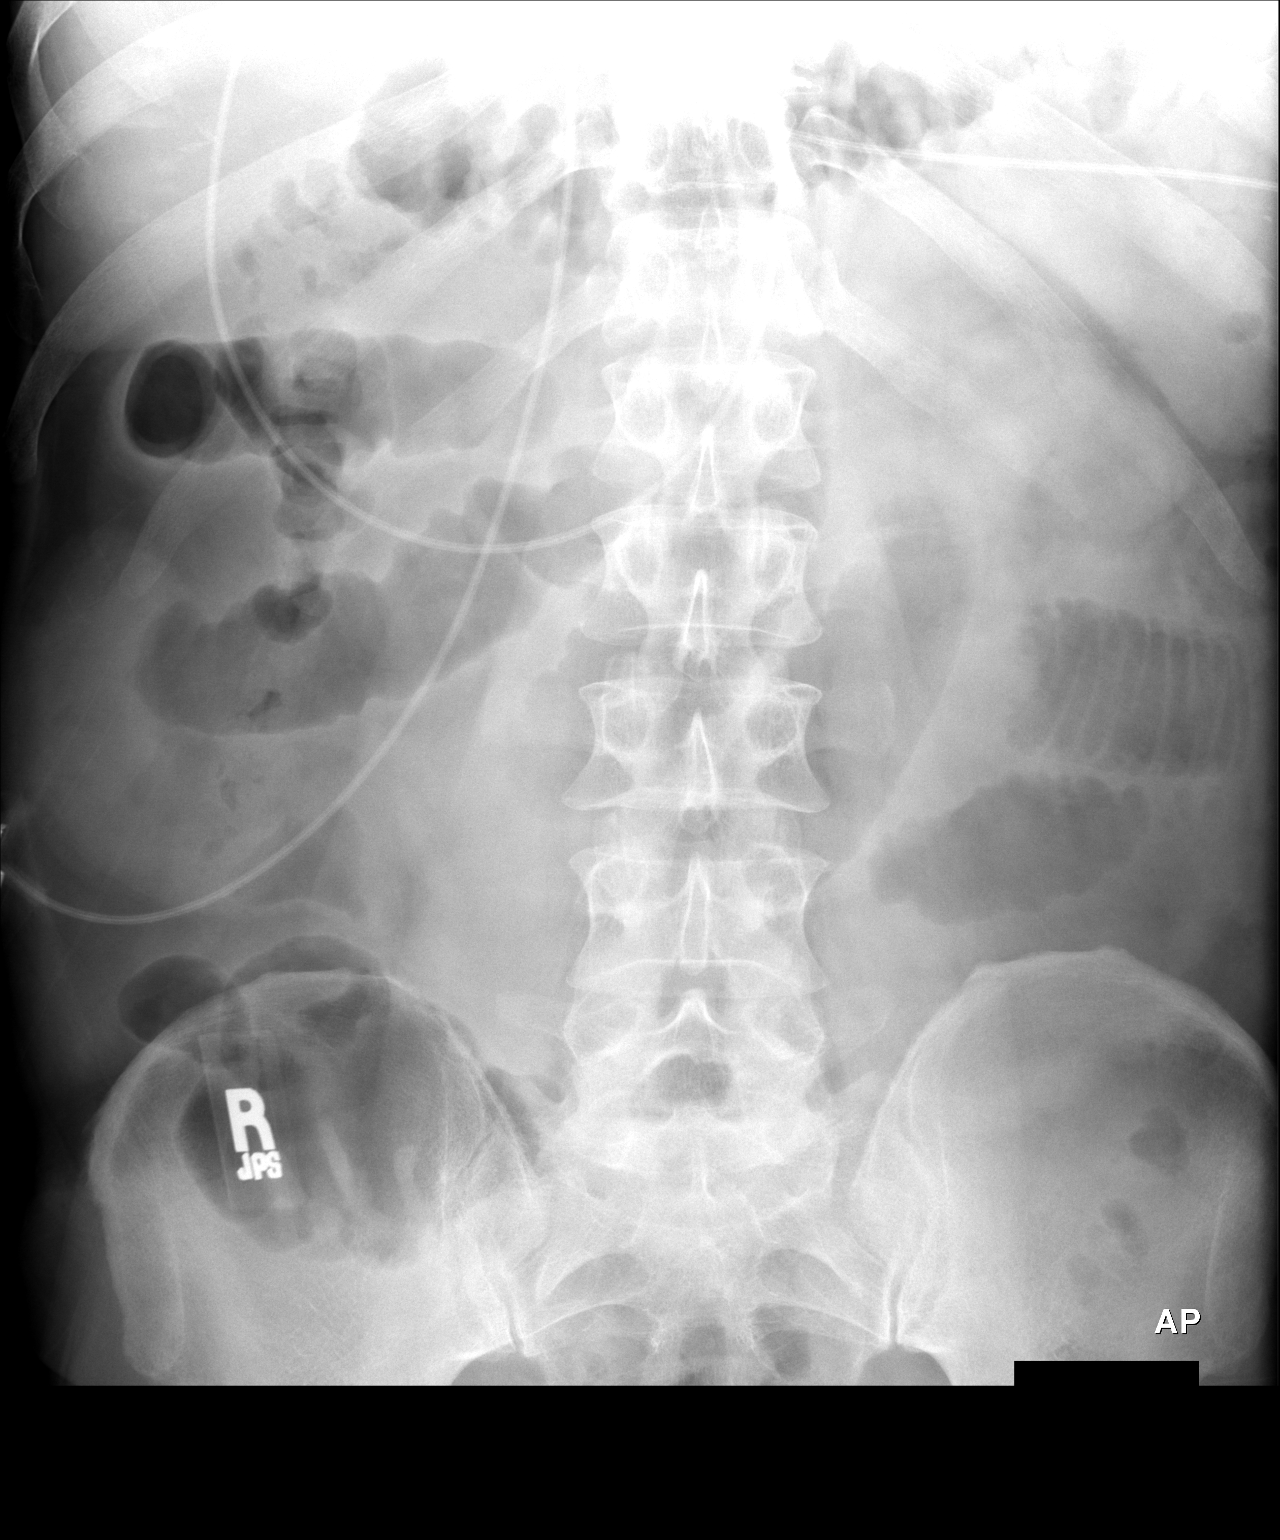

[1 of 1 positions shown; findings below may reference images not displayed]

FINDINGS: Mild-to-moderate small bowel distension in the mid abdomen measuring
up to 4.1 cm in the left abdomen. Air present throughout the colon.
Appearance remains nonspecific for ileus or mild partial
obstruction. No abnormal calcifications or acute osseous finding.
IMPRESSION: Persistent similar small bowel distension with air present in the
colon.

## 2020-01-22 ENCOUNTER — Encounter: Payer: Managed Care, Other (non HMO) | Admitting: Primary Care

## 2020-02-05 ENCOUNTER — Other Ambulatory Visit: Payer: Self-pay | Admitting: Primary Care

## 2020-02-05 DIAGNOSIS — J309 Allergic rhinitis, unspecified: Secondary | ICD-10-CM

## 2020-02-06 ENCOUNTER — Other Ambulatory Visit: Payer: Self-pay | Admitting: Primary Care

## 2020-02-06 DIAGNOSIS — I10 Essential (primary) hypertension: Secondary | ICD-10-CM

## 2020-03-08 ENCOUNTER — Other Ambulatory Visit: Payer: Self-pay | Admitting: Primary Care

## 2020-03-08 DIAGNOSIS — I10 Essential (primary) hypertension: Secondary | ICD-10-CM

## 2020-04-06 ENCOUNTER — Other Ambulatory Visit: Payer: Self-pay | Admitting: Primary Care

## 2020-04-06 DIAGNOSIS — I10 Essential (primary) hypertension: Secondary | ICD-10-CM

## 2020-05-18 ENCOUNTER — Other Ambulatory Visit: Payer: Self-pay | Admitting: Primary Care

## 2020-05-18 DIAGNOSIS — Z1159 Encounter for screening for other viral diseases: Secondary | ICD-10-CM

## 2020-05-18 DIAGNOSIS — I10 Essential (primary) hypertension: Secondary | ICD-10-CM

## 2020-05-18 DIAGNOSIS — R739 Hyperglycemia, unspecified: Secondary | ICD-10-CM

## 2020-05-31 ENCOUNTER — Other Ambulatory Visit (INDEPENDENT_AMBULATORY_CARE_PROVIDER_SITE_OTHER): Payer: Managed Care, Other (non HMO)

## 2020-05-31 ENCOUNTER — Other Ambulatory Visit: Payer: Self-pay

## 2020-05-31 DIAGNOSIS — I10 Essential (primary) hypertension: Secondary | ICD-10-CM | POA: Diagnosis not present

## 2020-05-31 DIAGNOSIS — R739 Hyperglycemia, unspecified: Secondary | ICD-10-CM | POA: Diagnosis not present

## 2020-05-31 DIAGNOSIS — Z1159 Encounter for screening for other viral diseases: Secondary | ICD-10-CM

## 2020-05-31 LAB — COMPREHENSIVE METABOLIC PANEL
ALT: 27 U/L (ref 0–53)
AST: 17 U/L (ref 0–37)
Albumin: 4.2 g/dL (ref 3.5–5.2)
Alkaline Phosphatase: 61 U/L (ref 39–117)
BUN: 12 mg/dL (ref 6–23)
CO2: 30 mEq/L (ref 19–32)
Calcium: 9 mg/dL (ref 8.4–10.5)
Chloride: 102 mEq/L (ref 96–112)
Creatinine, Ser: 1.4 mg/dL (ref 0.40–1.50)
GFR: 54.14 mL/min — ABNORMAL LOW (ref >60.00)
Glucose, Bld: 151 mg/dL — ABNORMAL HIGH (ref 70–99)
Potassium: 4.2 mEq/L (ref 3.5–5.1)
Sodium: 139 mEq/L (ref 135–145)
Total Bilirubin: 0.7 mg/dL (ref 0.2–1.2)
Total Protein: 6.8 g/dL (ref 6.0–8.3)

## 2020-05-31 LAB — LIPID PANEL
Cholesterol: 180 mg/dL (ref 0–200)
HDL: 40.5 mg/dL (ref >39.00)
LDL Cholesterol: 100 mg/dL — ABNORMAL HIGH (ref 0–99)
NonHDL: 139.6
Total CHOL/HDL Ratio: 4
Triglycerides: 196 mg/dL — ABNORMAL HIGH (ref 0.0–149.0)
VLDL: 39.2 mg/dL (ref 0.0–40.0)

## 2020-05-31 LAB — CBC
HCT: 44.9 % (ref 39.0–52.0)
Hemoglobin: 15.6 g/dL (ref 13.0–17.0)
MCHC: 34.7 g/dL (ref 30.0–36.0)
MCV: 87.2 fl (ref 78.0–100.0)
Platelets: 258 10*3/uL (ref 150.0–400.0)
RBC: 5.15 Mil/uL (ref 4.22–5.81)
RDW: 12.9 % (ref 11.5–15.5)
WBC: 7.1 10*3/uL (ref 4.0–10.5)

## 2020-05-31 LAB — HEMOGLOBIN A1C: Hgb A1c MFr Bld: 5.6 % (ref 4.6–6.5)

## 2020-06-01 LAB — HEPATITIS C ANTIBODY
Hepatitis C Ab: NONREACTIVE
SIGNAL TO CUT-OFF: 0.01 (ref <1.00)

## 2020-06-06 ENCOUNTER — Ambulatory Visit (INDEPENDENT_AMBULATORY_CARE_PROVIDER_SITE_OTHER): Payer: Managed Care, Other (non HMO) | Admitting: Primary Care

## 2020-06-06 ENCOUNTER — Encounter: Payer: Self-pay | Admitting: Primary Care

## 2020-06-06 ENCOUNTER — Other Ambulatory Visit: Payer: Self-pay

## 2020-06-06 VITALS — BP 140/90 | HR 82 | Temp 96.2°F | Ht 72.0 in | Wt 249.5 lb

## 2020-06-06 DIAGNOSIS — N289 Disorder of kidney and ureter, unspecified: Secondary | ICD-10-CM

## 2020-06-06 DIAGNOSIS — G4733 Obstructive sleep apnea (adult) (pediatric): Secondary | ICD-10-CM

## 2020-06-06 DIAGNOSIS — Z0000 Encounter for general adult medical examination without abnormal findings: Secondary | ICD-10-CM

## 2020-06-06 DIAGNOSIS — J309 Allergic rhinitis, unspecified: Secondary | ICD-10-CM | POA: Diagnosis not present

## 2020-06-06 DIAGNOSIS — I10 Essential (primary) hypertension: Secondary | ICD-10-CM

## 2020-06-06 DIAGNOSIS — N529 Male erectile dysfunction, unspecified: Secondary | ICD-10-CM

## 2020-06-06 MED ORDER — LISINOPRIL 20 MG PO TABS: ORAL_TABLET | ORAL | 3 refills | Status: DC

## 2020-06-06 MED ORDER — FLUTICASONE PROPIONATE 50 MCG/ACT NA SUSP: 1.0000 | Freq: Two times a day (BID) | NASAL | 2 refills | Status: DC | PRN

## 2020-06-06 MED ORDER — LORATADINE 10 MG PO TABS: ORAL_TABLET | ORAL | 3 refills | Status: DC

## 2020-06-06 NOTE — Assessment & Plan Note (Signed)
Above goal in the office today, has been out of lisinopril for about one month.  Refills provided. Recent CMP with decreased renal function. This is likely secondary to no water intake, poor diet, and uncontrolled hypertension.  Repeat CMP in one month.

## 2020-06-06 NOTE — Assessment & Plan Note (Signed)
Recent change in renal function with creatinine of 1.4, GFR of 54.  He drinks no water, so we had a frank discussion about the need to start.   BP uncontrolled which could also be contributing. Refills provided for ACE-I.   Repeat CMP in one month.

## 2020-06-06 NOTE — Assessment & Plan Note (Signed)
Non compliant to CPAP machine, discussed the importance of OSA treatment.

## 2020-06-06 NOTE — Assessment & Plan Note (Signed)
Tetanus UTD, not interested in Covid-19 vaccines. Colonoscopy due, he declines despite recommendations. Strongly advised he work on his diet, start drinking water. Exam today benign. Labs reviewed.

## 2020-06-06 NOTE — Patient Instructions (Signed)
Start drinking water as discussed.  Start exercising. You should be getting 150 minutes of moderate intensity exercise weekly.  It's important to improve your diet by reducing consumption of fast food, fried food, processed snack foods, sugary drinks. Increase consumption of fresh vegetables and fruits, whole grains, water.  Resume your lisinopril blood pressure medication. Monitor your blood pressure at home, it should run less than 130/90.  Please think about the colonoscopy.   Schedule a lab appointment for 1-2 months to repeat your kidney function.  It was a pleasure to see you today!   Preventive Care 58-66 Years Old, Male Preventive care refers to lifestyle choices and visits with your health care provider that can promote health and wellness. This includes:  A yearly physical exam. This is also called an annual well check.  Regular dental and eye exams.  Immunizations.  Screening for certain conditions.  Healthy lifestyle choices, such as eating a healthy diet, getting regular exercise, not using drugs or products that contain nicotine and tobacco, and limiting alcohol use. What can I expect for my preventive care visit? Physical exam Your health care provider will check:  Height and weight. These may be used to calculate body mass index (BMI), which is a measurement that tells if you are at a healthy weight.  Heart rate and blood pressure.  Your skin for abnormal spots. Counseling Your health care provider may ask you questions about:  Alcohol, tobacco, and drug use.  Emotional well-being.  Home and relationship well-being.  Sexual activity.  Eating habits.  Work and work Statistician. What immunizations do I need?  Influenza (flu) vaccine  This is recommended every year. Tetanus, diphtheria, and pertussis (Tdap) vaccine  You may need a Td booster every 10 years. Varicella (chickenpox) vaccine  You may need this vaccine if you have not already been  vaccinated. Zoster (shingles) vaccine  You may need this after age 48. Measles, mumps, and rubella (MMR) vaccine  You may need at least one dose of MMR if you were born in 1957 or later. You may also need a second dose. Pneumococcal conjugate (PCV13) vaccine  You may need this if you have certain conditions and were not previously vaccinated. Pneumococcal polysaccharide (PPSV23) vaccine  You may need one or two doses if you smoke cigarettes or if you have certain conditions. Meningococcal conjugate (MenACWY) vaccine  You may need this if you have certain conditions. Hepatitis A vaccine  You may need this if you have certain conditions or if you travel or work in places where you may be exposed to hepatitis A. Hepatitis B vaccine  You may need this if you have certain conditions or if you travel or work in places where you may be exposed to hepatitis B. Haemophilus influenzae type b (Hib) vaccine  You may need this if you have certain risk factors. Human papillomavirus (HPV) vaccine  If recommended by your health care provider, you may need three doses over 6 months. You may receive vaccines as individual doses or as more than one vaccine together in one shot (combination vaccines). Talk with your health care provider about the risks and benefits of combination vaccines. What tests do I need? Blood tests  Lipid and cholesterol levels. These may be checked every 5 years, or more frequently if you are over 39 years old.  Hepatitis C test.  Hepatitis B test. Screening  Lung cancer screening. You may have this screening every year starting at age 55 if you have a  30-pack-year history of smoking and currently smoke or have quit within the past 15 years.  Prostate cancer screening. Recommendations will vary depending on your family history and other risks.  Colorectal cancer screening. All adults should have this screening starting at age 47 and continuing until age 37. Your  health care provider may recommend screening at age 48 if you are at increased risk. You will have tests every 1-10 years, depending on your results and the type of screening test.  Diabetes screening. This is done by checking your blood sugar (glucose) after you have not eaten for a while (fasting). You may have this done every 1-3 years.  Sexually transmitted disease (STD) testing. Follow these instructions at home: Eating and drinking  Eat a diet that includes fresh fruits and vegetables, whole grains, lean protein, and low-fat dairy products.  Take vitamin and mineral supplements as recommended by your health care provider.  Do not drink alcohol if your health care provider tells you not to drink.  If you drink alcohol: ? Limit how much you have to 0-2 drinks a day. ? Be aware of how much alcohol is in your drink. In the [place] one drink equals one 12 oz bottle of beer (355 mL), one 5 oz glass of wine (148 mL), or one 1 oz glass of hard liquor (44 mL). Lifestyle  Take daily care of your teeth and gums.  Stay active. Exercise for at least 30 minutes on 5 or more days each week.  Do not use any products that contain nicotine or tobacco, such as cigarettes, e-cigarettes, and chewing tobacco. If you need help quitting, ask your health care provider.  If you are sexually active, practice safe sex. Use a condom or other form of protection to prevent STIs (sexually transmitted infections).  Talk with your health care provider about taking a low-dose aspirin every day starting at age 20. What's next?  Go to your health care provider once a year for a well check visit.  Ask your health care provider how often you should have your eyes and teeth checked.  Stay up to date on all vaccines. This information is not intended to replace advice given to you by your health care provider. Make sure you discuss any questions you have with your health care provider. Document Revised: 11/06/2018  Document Reviewed: 11/06/2018 Elsevier Patient Education  2020 [employer].

## 2020-06-06 NOTE — Progress Notes (Signed)
Subjective:    Patient ID: Edward Mccarty, male    DOB: 05-Mar-1971, 49 y.o.   MRN: [id number]  HPI  This visit occurred during the SARS-CoV-2 public health emergency.  Safety protocols were in place, including screening questions prior to the visit, additional usage of staff PPE, and extensive cleaning of exam room while observing appropriate contact time as indicated for disinfecting solutions.   Edward Mccarty is a 49 year old male who presents today for complete physical.  Immunizations: -Tetanus: Completed in 2018 -Influenza: Due this season  -Covid-19: Undecided   Diet: He endorses a poor diet, drinks no water. Only drinks soda and sweet tea.  Exercise: No regular exercise   Eye exam: No recent exam, plans on scheduling soon Dental exam: No recent exam  Colonoscopy: Never completed, declines today.  BP Readings from Last 3 Encounters:  06/06/20 140/90  05/30/19 (!) 149/91  01/20/19 124/86   Has been out of lisinopril for one month. He checks his BP when taking his lisinopril which runs "normal", doesn't really remember readings.   Review of Systems  Constitutional: Negative for unexpected weight change.  HENT: Negative for rhinorrhea.   Eyes: Negative for visual disturbance.  Respiratory: Negative for cough and shortness of breath.   Cardiovascular: Negative for chest pain.  Gastrointestinal: Negative for constipation and diarrhea.  Genitourinary: Negative for difficulty urinating.  Musculoskeletal: Negative for arthralgias and myalgias.  Skin: Negative for rash.  Allergic/Immunologic: Positive for environmental allergies.  Neurological: Positive for headaches. Negative for dizziness and numbness.  Psychiatric/Behavioral: The patient is not nervous/anxious.        Past Medical History:  Diagnosis Date  . Arthritis   . Heart murmur   . Hypertension   . OSA (obstructive sleep apnea) 05/15/2018  . Peyronie's disease      Social History   Socioeconomic History   . Marital status: Married    Spouse name: Not on file  . Number of children: Not on file  . Years of education: Not on file  . Highest education level: Not on file  Occupational History  . Not on file  Tobacco Use  . Smoking status: Former Smoker    Packs/day: 0.25    Years: 4.00    Pack years: 1.00    Types: Cigarettes    Quit date: 05/21/1991    Years since quitting: 29.0  . Smokeless tobacco: Never Used  Vaping Use  . Vaping Use: Never used  Substance and Sexual Activity  . Alcohol use: No  . Drug use: Not Currently    Types: Marijuana  . Sexual activity: Not on file  Other Topics Concern  . Not on file  Social History Narrative   Married.   3 children.   Works as an Radio broadcast assistant.   Enjoys working out, sleeping, relaxing.    Social Determinants of Health   Financial Resource Strain:   . Difficulty of Paying Living Expenses:   Food Insecurity:   . Worried About Charity fundraiser in the Last Year:   . Arboriculturist in the Last Year:   Transportation Needs:   . Film/video editor (Medical):   Marland Kitchen Lack of Transportation (Non-Medical):   Physical Activity:   . Days of Exercise per Week:   . Minutes of Exercise per Session:   Stress:   . Feeling of Stress :   Social Connections:   . Frequency of Communication with Friends and Family:   . Frequency of  Social Gatherings with Friends and Family:   . Attends Religious Services:   . Active Member of Clubs or Organizations:   . Attends Archivist Meetings:   Marland Kitchen Marital Status:   Intimate Partner Violence:   . Fear of Current or Ex-Partner:   . Emotionally Abused:   Marland Kitchen Physically Abused:   . Sexually Abused:     Past Surgical History:  Procedure Laterality Date  . MULTIPLE TOOTH EXTRACTIONS      Family History  Problem Relation Age of Onset  . Hypertension Mother   . Stroke Mother   . Heart disease Mother   . Hypertension Father   . Stroke Father   . Diabetes Father   . Heart disease  Father   . Diabetes Brother   . Lung cancer Paternal Grandmother     No Known Allergies  Current Outpatient Medications on File Prior to Visit  Medication Sig Dispense Refill  . fluticasone (FLONASE) 50 MCG/ACT nasal spray PLACE 1 SPRAY INTO BOTH NOSTRILS 2 TIMES DAILY AS NEEDED FOR ALLERGIES OR RHINITIS. 16 mL 2  . lisinopril (ZESTRIL) 20 MG tablet TAKE 1 TABLET BY MOUTH DAILY. FOR BLOOD PRESSURE NEED APPOINTMENT FOR ANY MORE REFILLS 30 tablet 0  . loratadine (CLARITIN) 10 MG tablet TAKE 1 TABLET BY MOUTH DAILY FOR ALLERGIES 90 tablet 0  . sildenafil (VIAGRA) 50 MG tablet Take 1/2 to 1 tablet by mouth 30 minutes prior to intercourse. 10 tablet 0   No current facility-administered medications on file prior to visit.    BP 140/90   Pulse 82   Temp (!) 96.2 F (35.7 C) (Temporal)   Ht 6' (1.829 m)   Wt 249 lb 8 oz (113.2 kg)   SpO2 98%   BMI 33.84 kg/m    Objective:   Physical Exam HENT:     Right Ear: Tympanic membrane and ear canal normal.     Left Ear: Tympanic membrane and ear canal normal.  Eyes:     Pupils: Pupils are equal, round, and reactive to light.  Cardiovascular:     Rate and Rhythm: Normal rate and regular rhythm.  Pulmonary:     Effort: Pulmonary effort is normal.     Breath sounds: Normal breath sounds.  Abdominal:     General: Bowel sounds are normal.     Palpations: Abdomen is soft.     Tenderness: There is no abdominal tenderness.  Musculoskeletal:        General: Normal range of motion.     Cervical back: Neck supple.  Skin:    General: Skin is warm and dry.  Neurological:     Mental Status: He is alert and oriented to person, place, and time.     Cranial Nerves: No cranial nerve deficit.     Deep Tendon Reflexes:     Reflex Scores:      Patellar reflexes are 2+ on the right side and 2+ on the left side. Psychiatric:        Mood and Affect: Mood normal.            Assessment & Plan:

## 2020-06-06 NOTE — Assessment & Plan Note (Signed)
Infrequent use of sildenafil. Continue to monitor.

## 2020-06-06 NOTE — Assessment & Plan Note (Signed)
Well controlled on Claritin and Flonase, refills provided.

## 2020-09-01 ENCOUNTER — Other Ambulatory Visit: Payer: Self-pay | Admitting: Primary Care

## 2020-09-01 DIAGNOSIS — J309 Allergic rhinitis, unspecified: Secondary | ICD-10-CM

## 2021-06-18 ENCOUNTER — Other Ambulatory Visit: Payer: Self-pay | Admitting: Primary Care

## 2021-06-18 DIAGNOSIS — I10 Essential (primary) hypertension: Secondary | ICD-10-CM

## 2021-07-15 ENCOUNTER — Other Ambulatory Visit: Payer: Self-pay | Admitting: Primary Care

## 2021-07-15 DIAGNOSIS — J309 Allergic rhinitis, unspecified: Secondary | ICD-10-CM

## 2021-07-22 ENCOUNTER — Other Ambulatory Visit: Payer: Self-pay | Admitting: Primary Care

## 2021-07-22 DIAGNOSIS — I10 Essential (primary) hypertension: Secondary | ICD-10-CM

## 2021-08-24 ENCOUNTER — Other Ambulatory Visit: Payer: Self-pay | Admitting: Primary Care

## 2021-08-24 DIAGNOSIS — I10 Essential (primary) hypertension: Secondary | ICD-10-CM

## 2021-09-28 ENCOUNTER — Encounter: Payer: Managed Care, Other (non HMO) | Admitting: Primary Care

## 2021-10-06 ENCOUNTER — Encounter: Payer: Managed Care, Other (non HMO) | Admitting: Primary Care

## 2021-10-10 ENCOUNTER — Ambulatory Visit (INDEPENDENT_AMBULATORY_CARE_PROVIDER_SITE_OTHER): Payer: Managed Care, Other (non HMO) | Admitting: Primary Care

## 2021-10-10 ENCOUNTER — Encounter: Payer: Self-pay | Admitting: Primary Care

## 2021-10-10 ENCOUNTER — Other Ambulatory Visit: Payer: Self-pay

## 2021-10-10 VITALS — BP 140/88 | HR 92 | Temp 98.0°F | Ht 72.0 in | Wt 246.0 lb

## 2021-10-10 DIAGNOSIS — J309 Allergic rhinitis, unspecified: Secondary | ICD-10-CM

## 2021-10-10 DIAGNOSIS — N529 Male erectile dysfunction, unspecified: Secondary | ICD-10-CM

## 2021-10-10 DIAGNOSIS — I10 Essential (primary) hypertension: Secondary | ICD-10-CM | POA: Diagnosis not present

## 2021-10-10 DIAGNOSIS — M545 Low back pain, unspecified: Secondary | ICD-10-CM

## 2021-10-10 DIAGNOSIS — N289 Disorder of kidney and ureter, unspecified: Secondary | ICD-10-CM

## 2021-10-10 DIAGNOSIS — R739 Hyperglycemia, unspecified: Secondary | ICD-10-CM | POA: Diagnosis not present

## 2021-10-10 DIAGNOSIS — Z0000 Encounter for general adult medical examination without abnormal findings: Secondary | ICD-10-CM | POA: Diagnosis not present

## 2021-10-10 DIAGNOSIS — G8929 Other chronic pain: Secondary | ICD-10-CM | POA: Insufficient documentation

## 2021-10-10 LAB — CBC
HCT: 45.9 % (ref 39.0–52.0)
Hemoglobin: 15.7 g/dL (ref 13.0–17.0)
MCHC: 34.3 g/dL (ref 30.0–36.0)
MCV: 86.7 fl (ref 78.0–100.0)
Platelets: 254 10*3/uL (ref 150.0–400.0)
RBC: 5.29 Mil/uL (ref 4.22–5.81)
RDW: 12.8 % (ref 11.5–15.5)
WBC: 7 10*3/uL (ref 4.0–10.5)

## 2021-10-10 LAB — LIPID PANEL
Cholesterol: 184 mg/dL (ref 0–200)
HDL: 36.3 mg/dL — ABNORMAL LOW (ref >39.00)
NonHDL: 147.64
Total CHOL/HDL Ratio: 5
Triglycerides: 216 mg/dL — ABNORMAL HIGH (ref 0.0–149.0)
VLDL: 43.2 mg/dL — ABNORMAL HIGH (ref 0.0–40.0)

## 2021-10-10 LAB — COMPREHENSIVE METABOLIC PANEL
ALT: 27 U/L (ref 0–53)
AST: 19 U/L (ref 0–37)
Albumin: 4.3 g/dL (ref 3.5–5.2)
Alkaline Phosphatase: 67 U/L (ref 39–117)
BUN: 11 mg/dL (ref 6–23)
CO2: 30 mEq/L (ref 19–32)
Calcium: 9.2 mg/dL (ref 8.4–10.5)
Chloride: 103 mEq/L (ref 96–112)
Creatinine, Ser: 1.38 mg/dL (ref 0.40–1.50)
GFR: 60.2 mL/min (ref >60.00)
Glucose, Bld: 120 mg/dL — ABNORMAL HIGH (ref 70–99)
Potassium: 4.4 mEq/L (ref 3.5–5.1)
Sodium: 139 mEq/L (ref 135–145)
Total Bilirubin: 1 mg/dL (ref 0.2–1.2)
Total Protein: 7.4 g/dL (ref 6.0–8.3)

## 2021-10-10 LAB — HEMOGLOBIN A1C: Hgb A1c MFr Bld: 6.1 % (ref 4.6–6.5)

## 2021-10-10 LAB — LDL CHOLESTEROL, DIRECT: Direct LDL: 127 mg/dL

## 2021-10-10 MED ORDER — LISINOPRIL 20 MG PO TABS: ORAL_TABLET | ORAL | 3 refills | Status: DC

## 2021-10-10 NOTE — Assessment & Plan Note (Signed)
Repeat labs pending

## 2021-10-10 NOTE — Assessment & Plan Note (Signed)
Chronic for years, will be following up with orthopedics given ongoing pain.

## 2021-10-10 NOTE — Progress Notes (Signed)
Subjective:    Patient ID: Edward Mccarty, male    DOB: [birth year], 50 y.o.   MRN: [id number]  HPI  Edward Mccarty is a very pleasant 50 y.o. male who presents today for complete physical and follow up of chronic conditions.  Immunizations: -Tetanus: 2018 -Influenza: Declines  -Covid-19: 2 vaccines  Diet: Fair diet.  Exercise: No regular exercise.  Eye exam: NO recent exam. Dental exam: Completes semi-annually   Colonoscopy: Never completed, declines today.  BP Readings from Last 3 Encounters:  10/10/21 140/88  06/06/20 140/90  05/30/19 (!) 149/91    He has been out of his lisinopril for one month. He has had a few headaches since.   He does plan on following up with his orthopedic doctor for chronic back pain.    Review of Systems  Constitutional:  Negative for unexpected weight change.  HENT:  Negative for rhinorrhea.   Respiratory:  Negative for shortness of breath.   Cardiovascular:  Negative for chest pain.  Gastrointestinal:  Negative for constipation and diarrhea.  Genitourinary:  Negative for difficulty urinating.  Musculoskeletal:  Positive for arthralgias and back pain. Negative for myalgias.  Skin:  Negative for rash.  Allergic/Immunologic: Negative for environmental allergies.  Neurological:  Positive for headaches. Negative for dizziness.  Psychiatric/Behavioral:  The patient is not nervous/anxious.         Past Medical History:  Diagnosis Date   AKI (acute kidney injury) ([place]) 05/28/2019   Arthritis    Heart murmur    Hypertension    OSA (obstructive sleep apnea) 05/15/2018   Partial small bowel obstruction ([place]) 05/27/2019   Peyronie's disease     Social History   Socioeconomic History   Marital status: Married    Spouse name: Not on file   Number of children: Not on file   Years of education: Not on file   Highest education level: Not on file  Occupational History   Not on file  Tobacco Use   Smoking status: Former    Packs/day:  0.25    Years: 4.00    Pack years: 1.00    Types: Cigarettes    Quit date: 05/21/1991    Years since quitting: 30.4   Smokeless tobacco: Never  Vaping Use   Vaping Use: Never used  Substance and Sexual Activity   Alcohol use: No   Drug use: Not Currently    Types: Marijuana   Sexual activity: Not on file  Other Topics Concern   Not on file  Social History Narrative   Married.   3 children.   Works as an Radio broadcast assistant.   Enjoys working out, sleeping, relaxing.    Social Determinants of Health   Financial Resource Strain: Not on file  Food Insecurity: Not on file  Transportation Needs: Not on file  Physical Activity: Not on file  Stress: Not on file  Social Connections: Not on file  Intimate Partner Violence: Not on file    Past Surgical History:  Procedure Laterality Date   MULTIPLE TOOTH EXTRACTIONS      Family History  Problem Relation Age of Onset   Hypertension Mother    Stroke Mother    Heart disease Mother    Hypertension Father    Stroke Father    Diabetes Father    Heart disease Father    Diabetes Brother    Lung cancer Paternal Grandmother     No Known Allergies  Current Outpatient Medications on File Prior to Visit  Medication Sig Dispense Refill   fluticasone (FLONASE) 50 MCG/ACT nasal spray PLACE 1 SPRAY INTO BOTH NOSTRILS 2 TIMES DAILY AS NEEDED FOR ALLERGIES OR RHINITIS. 16 mL 2   lisinopril (ZESTRIL) 20 MG tablet TAKE 1 TABLET BY MOUTH EVERY DAY FOR BLOOD PRESSURE **Office visit required for further refills. 30 tablet 0   loratadine (CLARITIN) 10 MG tablet TAKE 1 TABLET BY MOUTH DAILY FOR ALLERGIES 90 tablet 3   sildenafil (VIAGRA) 50 MG tablet Take 1/2 to 1 tablet by mouth 30 minutes prior to intercourse. 10 tablet 0   No current facility-administered medications on file prior to visit.    There were no vitals taken for this visit. Objective:   Physical Exam HENT:     Right Ear: Tympanic membrane and ear canal normal.     Left Ear:  Tympanic membrane and ear canal normal.     Nose: Nose normal.     Right Sinus: No maxillary sinus tenderness or frontal sinus tenderness.     Left Sinus: No maxillary sinus tenderness or frontal sinus tenderness.  Eyes:     Conjunctiva/sclera: Conjunctivae normal.  Neck:     Thyroid: No thyromegaly.     Vascular: No carotid bruit.  Cardiovascular:     Rate and Rhythm: Normal rate and regular rhythm.     Heart sounds: Normal heart sounds.  Pulmonary:     Effort: Pulmonary effort is normal.     Breath sounds: Normal breath sounds. No wheezing or rales.  Abdominal:     General: Bowel sounds are normal.     Palpations: Abdomen is soft.     Tenderness: There is no abdominal tenderness.  Musculoskeletal:        General: Normal range of motion.     Cervical back: Neck supple.  Skin:    General: Skin is warm and dry.  Neurological:     Mental Status: He is alert and oriented to person, place, and time.     Cranial Nerves: No cranial nerve deficit.     Deep Tendon Reflexes: Reflexes are normal and symmetric.  Psychiatric:        Mood and Affect: Mood normal.          Assessment & Plan:      This visit occurred during the SARS-CoV-2 public health emergency.  Safety protocols were in place, including screening questions prior to the visit, additional usage of staff PPE, and extensive cleaning of exam room while observing appropriate contact time as indicated for disinfecting solutions.

## 2021-10-10 NOTE — Assessment & Plan Note (Addendum)
Tetanus UTD Defers Flu vaccine  Declines Colonoscopy despite recommendations.  Advised him to work on his diet and initiate regular exercise routine.  Exam unremarkable. Labs pending.   I evaluated patient, was consulted regarding treatment, and agree with assessment and plan per Budd Palmer, RN, DNP student.   Allie Bossier, NP-C

## 2021-10-10 NOTE — Assessment & Plan Note (Addendum)
Above goal in the office today, he has been out of Lisinopril for approximately 1 month.  Discussed the importance of adherence, low sodium diet, and incorporating exercise to help control BP.   Refill provided.   Labs pending.   I evaluated patient, was consulted regarding treatment, and agree with assessment and plan per Budd Palmer, RN, DNP student.   Allie Bossier, NP-C

## 2021-10-10 NOTE — Progress Notes (Signed)
Established Patient Office Visit  Subjective:  Patient ID: Edward Mccarty, male    DOB: Jul 01, 1971  Age: 50 y.o. MRN: [id number]  CC: No chief complaint on file.   HPI Edward Mccarty is a 50 year old male for complete physical and follow up of chronic conditions. No acute complaints today.  Immunizations: -Tetanus: [facility], last done 01/21/2017 -Influenza: Due, defers -Covid-19: 2 doses of Phizer, last done 09/16/20   Diet: Fair diet, proteins and starches mainly. No snacks Exercise: No regular exercise, he lacks motivation.   Eye exam: Does not complete annually, it has been about 2 years Dental exam: Completes semi-annually    Colonoscopy: Due, never completed, defers   Wt Readings from Last 3 Encounters:  10/10/21 246 lb (111.6 kg)  06/06/20 249 lb 8 oz (113.2 kg)  06/09/19 234 lb (106.1 kg)    BP Readings from Last 3 Encounters:  10/10/21 140/88  06/06/20 140/90  05/30/19 (!) 149/91    He has been out of his Lisinopril or about one month. He is requesting a refill. He has occasional headaches which he attributes to his BP being elevated. These headaches cause peripheral vision disturbance, but are self-limiting. He does not take any medications to relieve the headache.      Past Medical History:  Diagnosis Date   AKI (acute kidney injury) ([place]) 05/28/2019   Arthritis    Heart murmur    Hypertension    OSA (obstructive sleep apnea) 05/15/2018   Partial small bowel obstruction ([place]) 05/27/2019   Peyronie's disease     Past Surgical History:  Procedure Laterality Date   MULTIPLE TOOTH EXTRACTIONS      Family History  Problem Relation Age of Onset   Hypertension Mother    Stroke Mother    Heart disease Mother    Hypertension Father    Stroke Father    Diabetes Father    Heart disease Father    Diabetes Brother    Lung cancer Paternal Grandmother     Social History   Socioeconomic History   Marital status: Married    Spouse name: Not on file    Number of children: Not on file   Years of education: Not on file   Highest education level: Not on file  Occupational History   Not on file  Tobacco Use   Smoking status: Former    Packs/day: 0.25    Years: 4.00    Pack years: 1.00    Types: Cigarettes    Quit date: 05/21/1991    Years since quitting: 30.4   Smokeless tobacco: Never  Vaping Use   Vaping Use: Never used  Substance and Sexual Activity   Alcohol use: No   Drug use: Not Currently    Types: Marijuana   Sexual activity: Not on file  Other Topics Concern   Not on file  Social History Narrative   Married.   3 children.   Works as an Radio broadcast assistant.   Enjoys working out, sleeping, relaxing.    Social Determinants of Health   Financial Resource Strain: Not on file  Food Insecurity: Not on file  Transportation Needs: Not on file  Physical Activity: Not on file  Stress: Not on file  Social Connections: Not on file  Intimate Partner Violence: Not on file    Outpatient Medications Prior to Visit  Medication Sig Dispense Refill   fluticasone (FLONASE) 50 MCG/ACT nasal spray PLACE 1 SPRAY INTO BOTH NOSTRILS 2 TIMES DAILY AS  NEEDED FOR ALLERGIES OR RHINITIS. 16 mL 2   lisinopril (ZESTRIL) 20 MG tablet TAKE 1 TABLET BY MOUTH EVERY DAY FOR BLOOD PRESSURE **Office visit required for further refills. 30 tablet 0   loratadine (CLARITIN) 10 MG tablet TAKE 1 TABLET BY MOUTH DAILY FOR ALLERGIES 90 tablet 3   sildenafil (VIAGRA) 50 MG tablet Take 1/2 to 1 tablet by mouth 30 minutes prior to intercourse. 10 tablet 0   No facility-administered medications prior to visit.    No Known Allergies  ROS Review of Systems  Constitutional:  Negative for activity change, appetite change, diaphoresis and fatigue.  HENT: Negative.    Eyes: Negative.   Respiratory: Negative.  Negative for chest tightness and shortness of breath.   Cardiovascular: Negative.  Negative for palpitations.  Gastrointestinal:  Negative for  constipation, diarrhea and vomiting.  Genitourinary: Negative.   Musculoskeletal:  Positive for back pain.       Chronic back pain  Neurological:  Positive for headaches.       Occasional headaches, self-limiting  Psychiatric/Behavioral: Negative.       Objective:    Physical Exam Constitutional:      Appearance: Normal appearance.  HENT:     Nose: Nose normal.     Mouth/Throat:     Dentition: Abnormal dentition.     Comments: Full upper and lower dentures Eyes:     Extraocular Movements: Extraocular movements intact.     Conjunctiva/sclera: Conjunctivae normal.     Pupils: Pupils are equal, round, and reactive to light.  Cardiovascular:     Pulses: Normal pulses.     Heart sounds: Normal heart sounds.  Pulmonary:     Effort: Pulmonary effort is normal.     Breath sounds: Normal breath sounds.  Abdominal:     General: Abdomen is flat. There is no distension.     Palpations: Abdomen is soft.     Tenderness: There is no abdominal tenderness. There is no guarding.  Musculoskeletal:     Cervical back: Normal range of motion.  Skin:    General: Skin is warm and dry.  Neurological:     General: No focal deficit present.     Mental Status: He is alert and oriented to person, place, and time.  Psychiatric:        Mood and Affect: Mood normal.        Behavior: Behavior normal.    There were no vitals taken for this visit. Wt Readings from Last 3 Encounters:  06/06/20 249 lb 8 oz (113.2 kg)  06/09/19 234 lb (106.1 kg)  05/27/19 230 lb (104.3 kg)     Health Maintenance Due  Topic Date Due   COLONOSCOPY (Pts 45-38yrs Insurance coverage will need to be confirmed)  Never done   COVID-19 Vaccine (3 - Booster for Pfizer series) 11/11/2020   INFLUENZA VACCINE  06/26/2021    There are no preventive care reminders to display for this patient.  Lab Results  Component Value Date   TSH 0.99 01/20/2019   Lab Results  Component Value Date   WBC 7.1 05/31/2020   HGB 15.6  05/31/2020   HCT 44.9 05/31/2020   MCV 87.2 05/31/2020   PLT 258.0 05/31/2020   Lab Results  Component Value Date   NA 139 05/31/2020   K 4.2 05/31/2020   CO2 30 05/31/2020   GLUCOSE 151 (H) 05/31/2020   BUN 12 05/31/2020   CREATININE 1.40 05/31/2020   BILITOT 0.7 05/31/2020   ALKPHOS 61  05/31/2020   AST 17 05/31/2020   ALT 27 05/31/2020   PROT 6.8 05/31/2020   ALBUMIN 4.2 05/31/2020   CALCIUM 9.0 05/31/2020   ANIONGAP 10 05/30/2019   GFR 54.14 (L) 05/31/2020   Lab Results  Component Value Date   CHOL 180 05/31/2020   Lab Results  Component Value Date   HDL 40.50 05/31/2020   Lab Results  Component Value Date   LDLCALC 100 (H) 05/31/2020   Lab Results  Component Value Date   TRIG 196.0 (H) 05/31/2020   Lab Results  Component Value Date   CHOLHDL 4 05/31/2020   Lab Results  Component Value Date   HGBA1C 5.6 05/31/2020      Assessment & Plan:   Problem List Items Addressed This Visit   None   No orders of the defined types were placed in this encounter.   Follow-up: No follow-ups on file.    Ninfa Meeker, RN

## 2021-10-10 NOTE — Patient Instructions (Addendum)
It was great to see you today! Resume your Lisinopril (BP medication), a refill has been sent into your pharmacy.   Stop by the lab prior to leaving today. I will notify you of your results once received.   Preventive Care 32-49 Years Old, Male Preventive care refers to lifestyle choices and visits with your health care provider that can promote health and wellness. Preventive care visits are also called wellness exams. What can I expect for my preventive care visit? Counseling During your preventive care visit, your health care provider may ask about your: Medical history, including: Past medical problems. Family medical history. Current health, including: Emotional well-being. Home life and relationship well-being. Sexual activity. Lifestyle, including: Alcohol, nicotine or tobacco, and drug use. Access to firearms. Diet, exercise, and sleep habits. Safety issues such as seatbelt and bike helmet use. Sunscreen use. Work and work Statistician. Physical exam Your health care provider will check your: Height and weight. These may be used to calculate your BMI (body mass index). BMI is a measurement that tells if you are at a healthy weight. Waist circumference. This measures the distance around your waistline. This measurement also tells if you are at a healthy weight and may help predict your risk of certain diseases, such as type 2 diabetes and high blood pressure. Heart rate and blood pressure. Body temperature. Skin for abnormal spots. What immunizations do I need? Vaccines are usually given at various ages, according to a schedule. Your health care provider will recommend vaccines for you based on your age, medical history, and lifestyle or other factors, such as travel or where you work. What tests do I need? Screening Your health care provider may recommend screening tests for certain conditions. This may include: Lipid and cholesterol levels. Diabetes screening. This is  done by checking your blood sugar (glucose) after you have not eaten for a while (fasting). Hepatitis B test. Hepatitis C test. HIV (human immunodeficiency virus) test. STI (sexually transmitted infection) testing, if you are at risk. Lung cancer screening. Prostate cancer screening. Colorectal cancer screening. Talk with your health care provider about your test results, treatment options, and if necessary, the need for more tests. Follow these instructions at home: Eating and drinking  Eat a diet that includes fresh fruits and vegetables, whole grains, lean protein, and low-fat dairy products. Take vitamin and mineral supplements as recommended by your health care provider. Do not drink alcohol if your health care provider tells you not to drink. If you drink alcohol: Limit how much you have to 0-2 drinks a day. Know how much alcohol is in your drink. In the [place] one drink equals one 12 oz bottle of beer (355 mL), one 5 oz glass of wine (148 mL), or one 1 oz glass of hard liquor (44 mL). Lifestyle Brush your teeth every morning and night with fluoride toothpaste. Floss one time each day. Exercise for at least 30 minutes 5 or more days each week. Do not use any products that contain nicotine or tobacco. These products include cigarettes, chewing tobacco, and vaping devices, such as e-cigarettes. If you need help quitting, ask your health care provider. Do not use drugs. If you are sexually active, practice safe sex. Use a condom or other form of protection to prevent STIs. Take aspirin only as told by your health care provider. Make sure that you understand how much to take and what form to take. Work with your health care provider to find out whether it is safe and  beneficial for you to take aspirin daily. Find healthy ways to manage stress, such as: Meditation, yoga, or listening to music. Journaling. Talking to a trusted person. Spending time with friends and family. Minimize  exposure to UV radiation to reduce your risk of skin cancer. Safety Always wear your seat belt while driving or riding in a vehicle. Do not drive: If you have been drinking alcohol. Do not ride with someone who has been drinking. When you are tired or distracted. While texting. If you have been using any mind-altering substances or drugs. Wear a helmet and other protective equipment during sports activities. If you have firearms in your house, make sure you follow all gun safety procedures. What's next? Go to your health care provider once a year for an annual wellness visit. Ask your health care provider how often you should have your eyes and teeth checked. Stay up to date on all vaccines. This information is not intended to replace advice given to you by your health care provider. Make sure you discuss any questions you have with your health care provider. Document Revised: 05/10/2021 Document Reviewed: 05/10/2021 Elsevier Patient Education  [place].

## 2021-10-10 NOTE — Assessment & Plan Note (Signed)
Doing well on PRN Flonase, continue same.

## 2021-10-10 NOTE — Assessment & Plan Note (Signed)
No recent use of sildenafil 50 mg. No concerns today.

## 2021-10-18 DIAGNOSIS — J309 Allergic rhinitis, unspecified: Secondary | ICD-10-CM

## 2021-10-18 MED ORDER — LORATADINE 10 MG PO TABS: ORAL_TABLET | ORAL | 3 refills | Status: DC

## 2022-02-02 ENCOUNTER — Encounter: Payer: Managed Care, Other (non HMO) | Admitting: Primary Care

## 2022-10-24 ENCOUNTER — Other Ambulatory Visit: Payer: Self-pay | Admitting: Primary Care

## 2022-10-24 DIAGNOSIS — J309 Allergic rhinitis, unspecified: Secondary | ICD-10-CM

## 2022-11-01 ENCOUNTER — Other Ambulatory Visit: Payer: Self-pay | Admitting: Primary Care

## 2022-11-01 DIAGNOSIS — I10 Essential (primary) hypertension: Secondary | ICD-10-CM

## 2022-11-21 ENCOUNTER — Ambulatory Visit (INDEPENDENT_AMBULATORY_CARE_PROVIDER_SITE_OTHER): Payer: Managed Care, Other (non HMO) | Admitting: Primary Care

## 2022-11-21 ENCOUNTER — Encounter: Payer: Self-pay | Admitting: Primary Care

## 2022-11-21 VITALS — BP 118/88 | HR 100 | Temp 97.8°F | Ht 72.0 in | Wt 243.0 lb

## 2022-11-21 DIAGNOSIS — Z1211 Encounter for screening for malignant neoplasm of colon: Secondary | ICD-10-CM

## 2022-11-21 DIAGNOSIS — I10 Essential (primary) hypertension: Secondary | ICD-10-CM | POA: Diagnosis not present

## 2022-11-21 DIAGNOSIS — Z8719 Personal history of other diseases of the digestive system: Secondary | ICD-10-CM

## 2022-11-21 DIAGNOSIS — Z0000 Encounter for general adult medical examination without abnormal findings: Secondary | ICD-10-CM

## 2022-11-21 DIAGNOSIS — Z125 Encounter for screening for malignant neoplasm of prostate: Secondary | ICD-10-CM | POA: Diagnosis not present

## 2022-11-21 DIAGNOSIS — R7303 Prediabetes: Secondary | ICD-10-CM | POA: Diagnosis not present

## 2022-11-21 DIAGNOSIS — G4733 Obstructive sleep apnea (adult) (pediatric): Secondary | ICD-10-CM | POA: Diagnosis not present

## 2022-11-21 DIAGNOSIS — M545 Low back pain, unspecified: Secondary | ICD-10-CM

## 2022-11-21 DIAGNOSIS — G8929 Other chronic pain: Secondary | ICD-10-CM

## 2022-11-21 LAB — COMPREHENSIVE METABOLIC PANEL
ALT: 37 U/L (ref 0–53)
AST: 19 U/L (ref 0–37)
Albumin: 4.3 g/dL (ref 3.5–5.2)
Alkaline Phosphatase: 60 U/L (ref 39–117)
BUN: 10 mg/dL (ref 6–23)
CO2: 31 mEq/L (ref 19–32)
Calcium: 9.3 mg/dL (ref 8.4–10.5)
Chloride: 102 mEq/L (ref 96–112)
Creatinine, Ser: 1.25 mg/dL (ref 0.40–1.50)
GFR: 67.26 mL/min (ref >60.00)
Glucose, Bld: 111 mg/dL — ABNORMAL HIGH (ref 70–99)
Potassium: 4.6 mEq/L (ref 3.5–5.1)
Sodium: 139 mEq/L (ref 135–145)
Total Bilirubin: 1.3 mg/dL — ABNORMAL HIGH (ref 0.2–1.2)
Total Protein: 7.2 g/dL (ref 6.0–8.3)

## 2022-11-21 LAB — LIPID PANEL
Cholesterol: 166 mg/dL (ref 0–200)
HDL: 36.3 mg/dL — ABNORMAL LOW (ref >39.00)
LDL Cholesterol: 94 mg/dL (ref 0–99)
NonHDL: 130.07
Total CHOL/HDL Ratio: 5
Triglycerides: 179 mg/dL — ABNORMAL HIGH (ref 0.0–149.0)
VLDL: 35.8 mg/dL (ref 0.0–40.0)

## 2022-11-21 LAB — HEMOGLOBIN A1C: Hgb A1c MFr Bld: 6.2 % (ref 4.6–6.5)

## 2022-11-21 LAB — PSA: PSA: 1.03 ng/mL (ref 0.10–4.00)

## 2022-11-21 NOTE — Assessment & Plan Note (Signed)
Controlled.  Continue lisinopril 20 mg daily.  CMP pending. 

## 2022-11-21 NOTE — Assessment & Plan Note (Signed)
Declines Shingrix and influenza vaccines. PSA due and pending. Colonoscopy due, referral placed to GI.  Discussed the importance of a healthy diet and regular exercise in order for weight loss, and to reduce the risk of further co-morbidity.  Exam stable. Labs pending.  Follow up in 1 year for repeat physical.

## 2022-11-21 NOTE — Assessment & Plan Note (Signed)
Partial.  Reviewed hospital notes from July 2020. Given recent episode, coupled with need for screening colonoscopy, will refer to GI.  Exam today benign.

## 2022-11-21 NOTE — Assessment & Plan Note (Addendum)
Chronic and ongoing. Recommended to increase physical activity.   Offered referral for second opinion, he kindly declines for now as he will be changing roles at work. He will notify when ready.

## 2022-11-21 NOTE — Assessment & Plan Note (Signed)
Discussed the importance of a healthy diet and regular exercise in order for weight loss, and to reduce the risk of further co-morbidity. ? ?Repeat A1C pending. ?

## 2022-11-21 NOTE — Patient Instructions (Signed)
Stop by the lab prior to leaving today. I will notify you of your results once received.   You will either be contacted via phone regarding your referral to GI, or you may receive a letter on your MyChart portal from our referral team with instructions for scheduling an appointment. Please let us know if you have not been contacted by anyone within two weeks.  It was a pleasure to see you today!  Preventive Care 56-50 Years Old, Male Preventive care refers to lifestyle choices and visits with your health care provider that can promote health and wellness. Preventive care visits are also called wellness exams. What can I expect for my preventive care visit? Counseling During your preventive care visit, your health care provider may ask about your: Medical history, including: Past medical problems. Family medical history. Current health, including: Emotional well-being. Home life and relationship well-being. Sexual activity. Lifestyle, including: Alcohol, nicotine or tobacco, and drug use. Access to firearms. Diet, exercise, and sleep habits. Safety issues such as seatbelt and bike helmet use. Sunscreen use. Work and work Statistician. Physical exam Your health care provider will check your: Height and weight. These may be used to calculate your BMI (body mass index). BMI is a measurement that tells if you are at a healthy weight. Waist circumference. This measures the distance around your waistline. This measurement also tells if you are at a healthy weight and may help predict your risk of certain diseases, such as type 2 diabetes and high blood pressure. Heart rate and blood pressure. Body temperature. Skin for abnormal spots. What immunizations do I need?  Vaccines are usually given at various ages, according to a schedule. Your health care provider will recommend vaccines for you based on your age, medical history, and lifestyle or other factors, such as travel or where you  work. What tests do I need? Screening Your health care provider may recommend screening tests for certain conditions. This may include: Lipid and cholesterol levels. Diabetes screening. This is done by checking your blood sugar (glucose) after you have not eaten for a while (fasting). Hepatitis B test. Hepatitis C test. HIV (human immunodeficiency virus) test. STI (sexually transmitted infection) testing, if you are at risk. Lung cancer screening. Prostate cancer screening. Colorectal cancer screening. Talk with your health care provider about your test results, treatment options, and if necessary, the need for more tests. Follow these instructions at home: Eating and drinking  Eat a diet that includes fresh fruits and vegetables, whole grains, lean protein, and low-fat dairy products. Take vitamin and mineral supplements as recommended by your health care provider. Do not drink alcohol if your health care provider tells you not to drink. If you drink alcohol: Limit how much you have to 0-2 drinks a day. Know how much alcohol is in your drink. In the [place] one drink equals one 12 oz bottle of beer (355 mL), one 5 oz glass of wine (148 mL), or one 1 oz glass of hard liquor (44 mL). Lifestyle Brush your teeth every morning and night with fluoride toothpaste. Floss one time each day. Exercise for at least 30 minutes 5 or more days each week. Do not use any products that contain nicotine or tobacco. These products include cigarettes, chewing tobacco, and vaping devices, such as e-cigarettes. If you need help quitting, ask your health care provider. Do not use drugs. If you are sexually active, practice safe sex. Use a condom or other form of protection to prevent STIs. Take aspirin  only as told by your health care provider. Make sure that you understand how much to take and what form to take. Work with your health care provider to find out whether it is safe and beneficial for you to take  aspirin daily. Find healthy ways to manage stress, such as: Meditation, yoga, or listening to music. Journaling. Talking to a trusted person. Spending time with friends and family. Minimize exposure to UV radiation to reduce your risk of skin cancer. Safety Always wear your seat belt while driving or riding in a vehicle. Do not drive: If you have been drinking alcohol. Do not ride with someone who has been drinking. When you are tired or distracted. While texting. If you have been using any mind-altering substances or drugs. Wear a helmet and other protective equipment during sports activities. If you have firearms in your house, make sure you follow all gun safety procedures. What's next? Go to your health care provider once a year for an annual wellness visit. Ask your health care provider how often you should have your eyes and teeth checked. Stay up to date on all vaccines. This information is not intended to replace advice given to you by your health care provider. Make sure you discuss any questions you have with your health care provider. Document Revised: 05/10/2021 Document Reviewed: 05/10/2021 Elsevier Patient Education  [place].

## 2022-11-21 NOTE — Progress Notes (Signed)
Subjective:    Patient ID: Edward Mccarty, male    DOB: [birth year], 51 y.o.   MRN: [id number]  HPI  Edward Mccarty is a very pleasant 51 y.o. male who presents today for complete physical and follow up of chronic conditions.  He continues to experience chronic lower back pain. Previously following with orthopedics who told him years ago that "there was nothing they can do". He's undergone several MRI lumbar spine images which show "arthritis". He is mostly sedentary. Will rarely take hydrocodone or oxycodone from older prescriptions for severe pain, otherwise doesn't take OTC meds for pain. He is interested in a second opinion for his back, will be traveling.  History of partial small bowel obstruction in July 2020, hospitalized at the time. About one week ago he developed similar symptoms of his prior partial bowel obstruction which included generalized abdominal pain with moderate bloating, inability to eat anything as he would vomit. He began a liquid diet only. Four days ago he began to immediately feel better. Has been able to eat and drink well for the last several days.  Immunizations: -Tetanus: 2018 -Influenza: Declines  -Shingles: Never completed, declines today  Diet: Poor diet.  Exercise: No regular exercise.  Eye exam: Completed several years ago Dental exam: Completed several years ago   Colonoscopy: Never completed, declined last year.  Agrees this year.  PSA: Due  BP Readings from Last 3 Encounters:  11/21/22 118/88  10/10/21 140/88  06/06/20 140/90       Review of Systems  Constitutional:  Negative for unexpected weight change.  HENT:  Negative for rhinorrhea.   Respiratory:  Negative for cough and shortness of breath.   Cardiovascular:  Negative for chest pain.  Gastrointestinal:  Negative for constipation and diarrhea.  Genitourinary:  Negative for difficulty urinating.  Musculoskeletal:  Positive for arthralgias and back pain.  Skin:  Negative for  rash.  Allergic/Immunologic: Negative for environmental allergies.  Neurological:  Negative for dizziness and headaches.  Psychiatric/Behavioral:  The patient is not nervous/anxious.          Past Medical History:  Diagnosis Date   AKI (acute kidney injury) ([facility]) 05/28/2019   Arthritis    Heart murmur    Hypertension    OSA (obstructive sleep apnea) 05/15/2018   Partial small bowel obstruction ([place]) 05/27/2019   Peyronie's disease     Social History   Socioeconomic History   Marital status: Married    Spouse name: Not on file   Number of children: Not on file   Years of education: Not on file   Highest education level: Not on file  Occupational History   Not on file  Tobacco Use   Smoking status: Former    Packs/day: 0.25    Years: 4.00    Total pack years: 1.00    Types: Cigarettes    Quit date: 05/21/1991    Years since quitting: 31.5   Smokeless tobacco: Never  Vaping Use   Vaping Use: Never used  Substance and Sexual Activity   Alcohol use: No   Drug use: Not Currently    Types: Marijuana   Sexual activity: Not on file  Other Topics Concern   Not on file  Social History Narrative   Married.   3 children.   Works as an Radio broadcast assistant.   Enjoys working out, sleeping, relaxing.    Social Determinants of Health   Financial Resource Strain: Not on file  Food Insecurity: Not on file  Transportation Needs: Not on file  Physical Activity: Not on file  Stress: Not on file  Social Connections: Not on file  Intimate Partner Violence: Not on file    Past Surgical History:  Procedure Laterality Date   MULTIPLE TOOTH EXTRACTIONS      Family History  Problem Relation Age of Onset   Hypertension Mother    Stroke Mother    Heart disease Mother    Hypertension Father    Stroke Father    Diabetes Father    Heart disease Father    Diabetes Brother    Lung cancer Paternal Grandmother     No Known Allergies  Current Outpatient Medications on File Prior  to Visit  Medication Sig Dispense Refill   fluticasone (FLONASE) 50 MCG/ACT nasal spray PLACE 1 SPRAY INTO BOTH NOSTRILS 2 TIMES DAILY AS NEEDED FOR ALLERGIES OR RHINITIS. 16 mL 2   lisinopril (ZESTRIL) 20 MG tablet TAKE 1 TABLET BY MOUTH EVERY DAY FOR BLOOD PRESSURE. Office visit required for further refills. 30 tablet 0   loratadine (CLARITIN) 10 MG tablet TAKE 1 TABLET BY MOUTH DAILY FOR ALLERGIES 90 tablet 3   sildenafil (VIAGRA) 50 MG tablet Take 1/2 to 1 tablet by mouth 30 minutes prior to intercourse. 10 tablet 0   No current facility-administered medications on file prior to visit.    BP 118/88 (BP Location: Left Arm, Patient Position: Sitting, Cuff Size: Normal)   Pulse 100   Temp 97.8 F (36.6 C) (Temporal)   Ht 6' (1.829 m)   Wt 243 lb (110.2 kg)   SpO2 97%   BMI 32.96 kg/m  Objective:   Physical Exam HENT:     Right Ear: Tympanic membrane and ear canal normal.     Left Ear: Tympanic membrane and ear canal normal.     Nose: Nose normal.     Right Sinus: No maxillary sinus tenderness or frontal sinus tenderness.     Left Sinus: No maxillary sinus tenderness or frontal sinus tenderness.  Eyes:     Conjunctiva/sclera: Conjunctivae normal.  Neck:     Thyroid: No thyromegaly.     Vascular: No carotid bruit.  Cardiovascular:     Rate and Rhythm: Normal rate and regular rhythm.     Heart sounds: Normal heart sounds.  Pulmonary:     Effort: Pulmonary effort is normal.     Breath sounds: Normal breath sounds. No wheezing or rales.  Abdominal:     General: Bowel sounds are normal.     Palpations: Abdomen is soft.     Tenderness: There is no abdominal tenderness.  Musculoskeletal:        General: Normal range of motion.     Cervical back: Neck supple.  Skin:    General: Skin is warm and dry.  Neurological:     Mental Status: He is alert and oriented to person, place, and time.     Cranial Nerves: No cranial nerve deficit.     Deep Tendon Reflexes: Reflexes are normal  and symmetric.  Psychiatric:        Mood and Affect: Mood normal.           Assessment & Plan:   Problem List Items Addressed This Visit       Cardiovascular and Mediastinum   Essential hypertension    Controlled.  Continue lisinopril 20 mg daily.  CMP pending.      Relevant Orders   Lipid panel   Comprehensive metabolic panel  Respiratory   OSA (obstructive sleep apnea)    Non compliant in years. Discussed the importance of regular use.        Other   Preventative health care - Primary    Declines Shingrix and influenza vaccines. PSA due and pending. Colonoscopy due, referral placed to GI.  Discussed the importance of a healthy diet and regular exercise in order for weight loss, and to reduce the risk of further co-morbidity.  Exam stable. Labs pending.  Follow up in 1 year for repeat physical.       Chronic back pain    Chronic and ongoing. Recommended to increase physical activity.   Offered referral for second opinion, he kindly declines for now as he will be changing roles at work. He will notify when ready.       History of small bowel obstruction    Partial.  Reviewed hospital notes from July 2020. Given recent episode, coupled with need for screening colonoscopy, will refer to GI.  Exam today benign.       Prediabetes    Discussed the importance of a healthy diet and regular exercise in order for weight loss, and to reduce the risk of further co-morbidity.  Repeat A1C pending.      Relevant Orders   Hemoglobin A1c   Other Visit Diagnoses     Screening for colon cancer       Relevant Orders   Ambulatory referral to Gastroenterology   Screening for prostate cancer       Relevant Orders   PSA          Pleas Koch, NP

## 2022-11-21 NOTE — Assessment & Plan Note (Signed)
Non compliant in years. Discussed the importance of regular use.

## 2022-11-23 ENCOUNTER — Other Ambulatory Visit: Payer: Self-pay | Admitting: Primary Care

## 2022-11-23 DIAGNOSIS — I10 Essential (primary) hypertension: Secondary | ICD-10-CM

## 2022-12-07 ENCOUNTER — Encounter: Payer: Self-pay | Admitting: Gastroenterology

## 2022-12-19 ENCOUNTER — Ambulatory Visit (AMBULATORY_SURGERY_CENTER): Payer: BC Managed Care – PPO

## 2022-12-19 VITALS — Ht 72.0 in | Wt 235.0 lb

## 2022-12-19 DIAGNOSIS — Z1211 Encounter for screening for malignant neoplasm of colon: Secondary | ICD-10-CM

## 2022-12-19 MED ORDER — NA SULFATE-K SULFATE-MG SULF 17.5-3.13-1.6 GM/177ML PO SOLN: 1.0000 | Freq: Once | ORAL | 0 refills | Status: AC

## 2022-12-19 NOTE — Progress Notes (Signed)
Pre visit completed via phone call; Patient verified name, DOB, and address;  No egg or soy allergy known to patient  No issues known to pt with past sedation with any surgeries or procedures Patient denies ever being told they had issues or difficulty with intubation  No FH of Malignant Hyperthermia Pt is not on diet pills Pt is not on home 02  Pt is not on blood thinners  Pt denies issues with constipation  No A fib or A flutter Have any cardiac testing pending--NO Pt instructed to use Singlecare.com or GoodRx for a price reduction on prep   Insurance verified during [place] appt=BCBS PPO  Patient's chart reviewed by Osvaldo Angst CNRA prior to previsit and patient appropriate for the [place].  Previsit completed and red dot placed by patient's name on their procedure day (on provider's schedule).    Instructions sent via MyChart per patient request;

## 2023-01-01 ENCOUNTER — Encounter: Payer: Self-pay | Admitting: Gastroenterology

## 2023-01-06 ENCOUNTER — Encounter: Payer: Self-pay | Admitting: Certified Registered Nurse Anesthetist

## 2023-01-11 ENCOUNTER — Telehealth: Payer: Self-pay

## 2023-01-11 ENCOUNTER — Ambulatory Visit: Payer: BC Managed Care – PPO | Admitting: Gastroenterology

## 2023-01-11 ENCOUNTER — Encounter: Payer: Self-pay | Admitting: Gastroenterology

## 2023-01-11 VITALS — BP 112/81 | HR 98 | Temp 97.7°F | Resp 15 | Ht 72.0 in | Wt 235.0 lb

## 2023-01-11 DIAGNOSIS — Z1211 Encounter for screening for malignant neoplasm of colon: Secondary | ICD-10-CM

## 2023-01-11 DIAGNOSIS — D128 Benign neoplasm of rectum: Secondary | ICD-10-CM | POA: Diagnosis not present

## 2023-01-11 DIAGNOSIS — K621 Rectal polyp: Secondary | ICD-10-CM | POA: Diagnosis not present

## 2023-01-11 DIAGNOSIS — Z8719 Personal history of other diseases of the digestive system: Secondary | ICD-10-CM

## 2023-01-11 MED ORDER — SODIUM CHLORIDE 0.9 % IV SOLN: 500.0000 mL | Freq: Once | INTRAVENOUS | Status: DC

## 2023-01-11 NOTE — Telephone Encounter (Signed)
-----   Message from Yetta Flock, MD sent at 01/11/2023  2:15 PM EST ----- Regarding: [place] can you please help order this patient a CT enterography study for history of small bowel obstruction? I just met him but he has a history of SBO in the past on prior CT and has been having recurrent obstructive symptoms and has not had imaging in a while. Thanks.   Dr. Loni Muse

## 2023-01-11 NOTE — Progress Notes (Signed)
Pt's states no medical or surgical changes since previsit or office visit. VS assessed by D.T 

## 2023-01-11 NOTE — Progress Notes (Signed)
1357 HR > 100 with esmolol 25 mg given IV, MD updated, vss

## 2023-01-11 NOTE — Progress Notes (Signed)
1350 HR > 100 with esmolol 25 mg given IV, MD updated, vss

## 2023-01-11 NOTE — Patient Instructions (Signed)
Handouts on polyps and hemorrhoids given to you today Await pathology results   YOU HAD AN ENDOSCOPIC PROCEDURE TODAY AT [facility]:   Refer to the procedure report that was given to you for any specific questions about what was found during the examination.  If the procedure report does not answer your questions, please call your gastroenterologist to clarify.  If you requested that your care partner not be given the details of your procedure findings, then the procedure report has been included in a sealed envelope for you to review at your convenience later.  YOU SHOULD EXPECT: Some feelings of bloating in the abdomen. Passage of more gas than usual.  Walking can help get rid of the air that was put into your GI tract during the procedure and reduce the bloating. If you had a lower endoscopy (such as a colonoscopy or flexible sigmoidoscopy) you may notice spotting of blood in your stool or on the toilet paper. If you underwent a bowel prep for your procedure, you may not have a normal bowel movement for a few days.  Please Note:  You might notice some irritation and congestion in your nose or some drainage.  This is from the oxygen used during your procedure.  There is no need for concern and it should clear up in a day or so.  SYMPTOMS TO REPORT IMMEDIATELY:  Following lower endoscopy (colonoscopy or flexible sigmoidoscopy):  Excessive amounts of blood in the stool  Significant tenderness or worsening of abdominal pains  Swelling of the abdomen that is new, acute  Fever of 100F or higher  For urgent or emergent issues, a gastroenterologist can be reached at any hour by calling [phone]. Do not use MyChart messaging for urgent concerns.    DIET:  We do recommend a small meal at first, but then you may proceed to your regular diet.  Drink plenty of fluids but you should avoid alcoholic beverages for 24 hours.  ACTIVITY:  You should plan to take it easy for the rest  of today and you should NOT DRIVE or use heavy machinery until tomorrow (because of the sedation medicines used during the test).    FOLLOW UP: Our staff will call the number listed on your records the next business day following your procedure.  We will call around 7:15- 8:00 am to check on you and address any questions or concerns that you may have regarding the information given to you following your procedure. If we do not reach you, we will leave a message.     If any biopsies were taken you will be contacted by phone or by letter within the next 1-3 weeks.  Please call us at [phone] if you have not heard about the biopsies in 3 weeks.    SIGNATURES/CONFIDENTIALITY: You and/or your care partner have signed paperwork which will be entered into your electronic medical record.  These signatures attest to the fact that that the information above on your After Visit Summary has been reviewed and is understood.  Full responsibility of the confidentiality of this discharge information lies with you and/or your care-partner.

## 2023-01-11 NOTE — Progress Notes (Signed)
1403 HR > 100 with esmolol 25 mg given IV, MD updated, vss

## 2023-01-11 NOTE — Progress Notes (Signed)
Called to room to assist during endoscopic procedure.  Patient ID and intended procedure confirmed with present staff. Received instructions for my participation in the procedure from the performing physician.  

## 2023-01-11 NOTE — Op Note (Signed)
[facility] Patient Name: Edward Mccarty Procedure Date: 01/11/2023 1:46 PM MRN: [id number] Endoscopist: Remo Lipps P. Havery Moros , MD, [id number] Age: 52 Referring MD:  Date of Birth: [birth year] Gender: Male Account #: [id number] Procedure:                Colonoscopy Indications:              Screening for colorectal malignant neoplasm, This                            is the patient's first colonoscopy Medicines:                Monitored Anesthesia Care Procedure:                Pre-Anesthesia Assessment:                           - Prior to the procedure, a History and Physical                            was performed, and patient medications and                            allergies were reviewed. The patient's tolerance of                            previous anesthesia was also reviewed. The risks                            and benefits of the procedure and the sedation                            options and risks were discussed with the patient.                            All questions were answered, and informed consent                            was obtained. Prior Anticoagulants: The patient has                            taken no anticoagulant or antiplatelet agents. ASA                            Grade Assessment: II - A patient with mild systemic                            disease. After reviewing the risks and benefits,                            the patient was deemed in satisfactory condition to                            undergo the procedure.  After obtaining informed consent, the colonoscope                            was passed under direct vision. Throughout the                            procedure, the patient's blood pressure, pulse, and                            oxygen saturations were monitored continuously. The                            CF [id number] was introduced through the anus                            and advanced to  the the cecum, identified by                            appendiceal orifice and ileocecal valve. The                            colonoscopy was performed without difficulty. The                            patient tolerated the procedure well. The quality                            of the bowel preparation was good. The ileocecal                            valve, appendiceal orifice, and rectum were                            photographed. Scope In: 1:51:00 PM Scope Out: 2:04:02 PM Scope Withdrawal Time: 0 hours 10 minutes 20 seconds  Total Procedure Duration: 0 hours 13 minutes 2 seconds  Findings:                 The perianal and digital rectal examinations were                            normal.                           Two sessile polyps were found in the rectum. The                            polyps were 3 to 4 mm in size. These polyps were                            removed with a cold snare. Resection and retrieval                            were complete.  Internal hemorrhoids were found during retroflexion.                           The exam was otherwise without abnormality. Complications:            No immediate complications. Estimated blood loss:                            Minimal. Estimated Blood Loss:     Estimated blood loss was minimal. Impression:               - Two 3 to 4 mm polyps in the rectum, removed with                            a cold snare. Resected and retrieved.                           - Internal hemorrhoids.                           - The examination was otherwise normal. Recommendation:           - Patient has a contact number available for                            emergencies. The signs and symptoms of potential                            delayed complications were discussed with the                            patient. Return to normal activities tomorrow.                            Written discharge instructions were  provided to the                            patient.                           - Resume previous diet.                           - Continue present medications.                           - Await pathology results. Remo Lipps P. Darlys Buis, MD 01/11/2023 2:09:02 PM This report has been signed electronically.

## 2023-01-11 NOTE — Progress Notes (Signed)
[facility] History and Physical   Primary Care Physician:  Pleas Koch, NP   Reason for Procedure:   Colon cancer screening  Plan:    colonoscopy     HPI: Edward Mccarty is a 52 y.o. male  here for colonoscopy screening /- first time exam.   Patient denies any bowel symptoms at this time. No family history of colon cancer known. Otherwise feels well without any cardiopulmonary symptoms. 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 Last episode in December, has not had imaging since 2020  I have discussed risks / benefits of anesthesia and endoscopic procedure with Mora Appl Peifer and they wish to proceed with the exams as outlined today.    Past Medical History:  Diagnosis Date   AKI (acute kidney injury) ([place]) 05/28/2019   Arthritis    generalized   Heart murmur    Hypertension    on meds   OSA (obstructive sleep apnea) 05/15/2018   does not use CPAP- has machine   Partial small bowel obstruction ([place]) 05/27/2019   Peyronie's disease    Seasonal allergies     Past Surgical History:  Procedure Laterality Date   MULTIPLE TOOTH EXTRACTIONS     did not receive anethesia- had all teeth removed- has upper and lower dentures    Prior to Admission medications   Medication Sig Start Date End Date Taking? Authorizing Provider  lisinopril (ZESTRIL) 20 MG tablet TAKE 1 TABLET BY MOUTH EVERY DAY FOR BLOOD PRESSURE. 11/23/22  Yes Pleas Koch, NP  loratadine (CLARITIN) 10 MG tablet TAKE 1 TABLET BY MOUTH DAILY FOR ALLERGIES 10/18/21  Yes Pleas Koch, NP  fluticasone (FLONASE) 50 MCG/ACT nasal spray PLACE 1 SPRAY INTO BOTH NOSTRILS 2 TIMES DAILY AS NEEDED FOR ALLERGIES OR RHINITIS. Patient not taking: Reported on 12/19/2022 09/01/20   Pleas Koch, NP  sildenafil (VIAGRA) 50 MG tablet Take 1/2 to 1 tablet by mouth 30 minutes prior to intercourse. 03/16/19   Pleas Koch, NP    Current  Outpatient Medications  Medication Sig Dispense Refill   lisinopril (ZESTRIL) 20 MG tablet TAKE 1 TABLET BY MOUTH EVERY DAY FOR BLOOD PRESSURE. 90 tablet 3   loratadine (CLARITIN) 10 MG tablet TAKE 1 TABLET BY MOUTH DAILY FOR ALLERGIES 90 tablet 3   fluticasone (FLONASE) 50 MCG/ACT nasal spray PLACE 1 SPRAY INTO BOTH NOSTRILS 2 TIMES DAILY AS NEEDED FOR ALLERGIES OR RHINITIS. (Patient not taking: Reported on 12/19/2022) 16 mL 2   sildenafil (VIAGRA) 50 MG tablet Take 1/2 to 1 tablet by mouth 30 minutes prior to intercourse. 10 tablet 0   Current Facility-Administered Medications  Medication Dose Route Frequency Provider Last Rate Last Admin   0.9 %  sodium chloride infusion  500 mL Intravenous Once Hodan Wurtz, Carlota Raspberry, MD        Allergies as of 01/11/2023   (No Known Allergies)    Family History  Problem Relation Age of Onset   Hypertension Mother    Stroke Mother    Heart disease Mother    Hypertension Father    Stroke Father    Diabetes Father    Heart disease Father    Diabetes Brother    Lung cancer Paternal Grandmother    Colon polyps Neg Hx    Colon cancer Neg Hx    Esophageal cancer Neg Hx    Stomach cancer Neg Hx    Rectal cancer Neg  Hx     Social History   Socioeconomic History   Marital status: Married    Spouse name: Not on file   Number of children: Not on file   Years of education: Not on file   Highest education level: Not on file  Occupational History   Not on file  Tobacco Use   Smoking status: Former    Packs/day: 0.25    Years: 4.00    Total pack years: 1.00    Types: Cigarettes    Quit date: 05/21/1991    Years since quitting: 31.6   Smokeless tobacco: Never  Vaping Use   Vaping Use: Never used  Substance and Sexual Activity   Alcohol use: No   Drug use: Not Currently    Types: Marijuana   Sexual activity: Not on file  Other Topics Concern   Not on file  Social History Narrative   Married.   3 children.   Works as an Designer, jewellery.   Enjoys working out, sleeping, relaxing.    Social Determinants of Health   Financial Resource Strain: Not on file  Food Insecurity: Not on file  Transportation Needs: Not on file  Physical Activity: Not on file  Stress: Not on file  Social Connections: Not on file  Intimate Partner Violence: Not on file    Review of Systems: All other review of systems negative except as mentioned in the HPI.  Physical Exam: Vital signs BP 134/79   Pulse (!) 115   Temp 97.7 F (36.5 C) (Skin)   Ht 6' (1.829 m)   Wt 235 lb (106.6 kg)   SpO2 96%   BMI 31.87 kg/m   General:   Alert,  Well-developed, pleasant and cooperative in NAD Lungs:  Clear throughout to auscultation.   Heart:  Regular rate and rhythm Abdomen:  Soft, nontender and nondistended.   Neuro/Psych:  Alert and cooperative. Normal mood and affect. A and O x 3  Jolly Mango, MD [facility]

## 2023-01-11 NOTE — Telephone Encounter (Signed)
CT enterography order in epic. Secure staff message sent to radiology scheduling to contact patient to set up appt.

## 2023-01-14 ENCOUNTER — Telehealth: Payer: Self-pay

## 2023-01-14 NOTE — Telephone Encounter (Signed)
  Follow up Call-     01/11/2023    1:14 PM  Call back number  Post procedure Call Back phone  # [phone]  Permission to leave phone message Yes     Patient questions:  Do you have a fever, pain , or abdominal swelling? No. Pain Score  0 *  Have you tolerated food without any problems? Yes.    Have you been able to return to your normal activities? Yes.    Do you have any questions about your discharge instructions: Diet   No. Medications  No. Follow up visit  No.  Do you have questions or concerns about your Care? No.  Actions: * If pain score is 4 or above: No action needed, pain <4.

## 2023-01-17 NOTE — Telephone Encounter (Signed)
CT enterography has not been scheduled. Secure staff message sent to radiology scheduling to follow up on request.

## 2023-01-17 NOTE — Telephone Encounter (Signed)
Edward Clinton, RN; [place], [name], Edward Mccarty 01/14/23 - call can't be completed  [name],  Can you try again, please  [name]

## 2023-01-18 NOTE — Telephone Encounter (Signed)
CT enterography scheduled for 3/10 at 10 am.

## 2023-02-03 ENCOUNTER — Ambulatory Visit (HOSPITAL_BASED_OUTPATIENT_CLINIC_OR_DEPARTMENT_OTHER)
Admission: RE | Admit: 2023-02-03 | Discharge: 2023-02-03 | Disposition: A | Discharge status: Home / Self Care | Payer: BC Managed Care – PPO | Source: Ambulatory Visit | Attending: Gastroenterology | Admitting: Gastroenterology

## 2023-02-03 DIAGNOSIS — R160 Hepatomegaly, not elsewhere classified: Secondary | ICD-10-CM | POA: Diagnosis not present

## 2023-02-03 DIAGNOSIS — N200 Calculus of kidney: Secondary | ICD-10-CM | POA: Diagnosis not present

## 2023-02-03 DIAGNOSIS — Z8719 Personal history of other diseases of the digestive system: Secondary | ICD-10-CM | POA: Diagnosis not present

## 2023-02-03 MED ORDER — IOHEXOL 300 MG/ML  SOLN
100.0000 mL | Freq: Once | INTRAMUSCULAR | Status: AC | PRN
  Administered 2023-02-03: 100 mL via INTRAVENOUS

## 2023-02-03 MED ORDER — BARIUM SULFATE 0.1 % PO SUSP
450.0000 mL | Freq: Once | ORAL | Status: AC
  Administered 2023-02-03: 450 mL via ORAL

## 2023-02-07 DIAGNOSIS — J309 Allergic rhinitis, unspecified: Secondary | ICD-10-CM

## 2023-02-07 MED ORDER — LORATADINE 10 MG PO TABS: ORAL_TABLET | ORAL | 2 refills | Status: DC

## 2023-05-08 ENCOUNTER — Emergency Department (HOSPITAL_COMMUNITY): Payer: BC Managed Care – PPO

## 2023-05-08 ENCOUNTER — Other Ambulatory Visit: Payer: Self-pay

## 2023-05-08 ENCOUNTER — Emergency Department (HOSPITAL_COMMUNITY)
Admission: EM | Admit: 2023-05-08 | Discharge: 2023-05-08 | Disposition: A | Discharge status: Home / Self Care | Payer: BC Managed Care – PPO | Attending: Emergency Medicine | Admitting: Emergency Medicine

## 2023-05-08 DIAGNOSIS — R1030 Lower abdominal pain, unspecified: Secondary | ICD-10-CM | POA: Diagnosis not present

## 2023-05-08 DIAGNOSIS — N200 Calculus of kidney: Secondary | ICD-10-CM | POA: Diagnosis not present

## 2023-05-08 DIAGNOSIS — R079 Chest pain, unspecified: Secondary | ICD-10-CM | POA: Diagnosis not present

## 2023-05-08 DIAGNOSIS — R0789 Other chest pain: Secondary | ICD-10-CM | POA: Insufficient documentation

## 2023-05-08 DIAGNOSIS — R7989 Other specified abnormal findings of blood chemistry: Secondary | ICD-10-CM | POA: Diagnosis not present

## 2023-05-08 DIAGNOSIS — I10 Essential (primary) hypertension: Secondary | ICD-10-CM | POA: Insufficient documentation

## 2023-05-08 DIAGNOSIS — Z79899 Other long term (current) drug therapy: Secondary | ICD-10-CM | POA: Insufficient documentation

## 2023-05-08 DIAGNOSIS — N132 Hydronephrosis with renal and ureteral calculous obstruction: Secondary | ICD-10-CM | POA: Diagnosis not present

## 2023-05-08 DIAGNOSIS — J9811 Atelectasis: Secondary | ICD-10-CM | POA: Diagnosis not present

## 2023-05-08 LAB — COMPREHENSIVE METABOLIC PANEL
ALT: 34 U/L (ref 0–44)
AST: 26 U/L (ref 15–41)
Albumin: 3.8 g/dL (ref 3.5–5.0)
Alkaline Phosphatase: 57 U/L (ref 38–126)
Anion gap: 12 (ref 5–15)
BUN: 16 mg/dL (ref 6–20)
CO2: 20 mmol/L — ABNORMAL LOW (ref 22–32)
Calcium: 8.8 mg/dL — ABNORMAL LOW (ref 8.9–10.3)
Chloride: 102 mmol/L (ref 98–111)
Creatinine, Ser: 1.54 mg/dL — ABNORMAL HIGH (ref 0.61–1.24)
GFR, Estimated: 55 mL/min — ABNORMAL LOW (ref >60)
Glucose, Bld: 237 mg/dL — ABNORMAL HIGH (ref 70–99)
Potassium: 3.7 mmol/L (ref 3.5–5.1)
Sodium: 134 mmol/L — ABNORMAL LOW (ref 135–145)
Total Bilirubin: 1.2 mg/dL (ref 0.3–1.2)
Total Protein: 6.9 g/dL (ref 6.5–8.1)

## 2023-05-08 LAB — CBC WITH DIFFERENTIAL/PLATELET
Abs Immature Granulocytes: 0.02 10*3/uL (ref 0.00–0.07)
Basophils Absolute: 0.1 10*3/uL (ref 0.0–0.1)
Basophils Relative: 1 %
Eosinophils Absolute: 0.1 10*3/uL (ref 0.0–0.5)
Eosinophils Relative: 2 %
HCT: 41.9 % (ref 39.0–52.0)
Hemoglobin: 14.4 g/dL (ref 13.0–17.0)
Immature Granulocytes: 0 %
Lymphocytes Relative: 21 %
Lymphs Abs: 1.6 10*3/uL (ref 0.7–4.0)
MCH: 29.4 pg (ref 26.0–34.0)
MCHC: 34.4 g/dL (ref 30.0–36.0)
MCV: 85.7 fL (ref 80.0–100.0)
Monocytes Absolute: 0.6 10*3/uL (ref 0.1–1.0)
Monocytes Relative: 8 %
Neutro Abs: 5 10*3/uL (ref 1.7–7.7)
Neutrophils Relative %: 68 %
Platelets: 233 10*3/uL (ref 150–400)
RBC: 4.89 MIL/uL (ref 4.22–5.81)
RDW: 11.9 % (ref 11.5–15.5)
WBC: 7.3 10*3/uL (ref 4.0–10.5)
nRBC: 0 % (ref 0.0–0.2)

## 2023-05-08 LAB — URINALYSIS, ROUTINE W REFLEX MICROSCOPIC
Bacteria, UA: NONE SEEN
Bilirubin Urine: NEGATIVE
Glucose, UA: 50 mg/dL — AB
Ketones, ur: NEGATIVE mg/dL
Leukocytes,Ua: NEGATIVE
Nitrite: NEGATIVE
Protein, ur: NEGATIVE mg/dL
Specific Gravity, Urine: 1.02 (ref 1.005–1.030)
pH: 5 (ref 5.0–8.0)

## 2023-05-08 LAB — TROPONIN I (HIGH SENSITIVITY)
Troponin I (High Sensitivity): 3 ng/L (ref <18)
Troponin I (High Sensitivity): 3 ng/L (ref <18)

## 2023-05-08 LAB — LIPASE, BLOOD: Lipase: 40 U/L (ref 11–51)

## 2023-05-08 MED ORDER — HYDROMORPHONE HCL 1 MG/ML IJ SOLN
1.0000 mg | Freq: Once | INTRAMUSCULAR | Status: AC
  Administered 2023-05-08: 1 mg via INTRAVENOUS
  Filled 2023-05-08: qty 1

## 2023-05-08 MED ORDER — SODIUM CHLORIDE 0.9 % IV BOLUS
1000.0000 mL | Freq: Once | INTRAVENOUS | Status: AC
  Administered 2023-05-08: 1000 mL via INTRAVENOUS

## 2023-05-08 MED ORDER — KETOROLAC TROMETHAMINE 15 MG/ML IJ SOLN
15.0000 mg | Freq: Once | INTRAMUSCULAR | Status: AC
  Administered 2023-05-08: 15 mg via INTRAVENOUS
  Filled 2023-05-08: qty 1

## 2023-05-08 MED ORDER — ONDANSETRON HCL 4 MG/2ML IJ SOLN
4.0000 mg | Freq: Once | INTRAMUSCULAR | Status: AC
  Administered 2023-05-08: 4 mg via INTRAVENOUS
  Filled 2023-05-08: qty 2

## 2023-05-08 MED ORDER — HYDROCODONE-ACETAMINOPHEN 5-325 MG PO TABS: 1.0000 | ORAL_TABLET | Freq: Four times a day (QID) | ORAL | 0 refills | Status: DC | PRN

## 2023-05-08 MED ORDER — TAMSULOSIN HCL 0.4 MG PO CAPS: 0.4000 mg | ORAL_CAPSULE | Freq: Every day | ORAL | 0 refills | Status: DC

## 2023-05-08 MED ORDER — OXYCODONE-ACETAMINOPHEN 5-325 MG PO TABS
1.0000 | ORAL_TABLET | Freq: Once | ORAL | Status: AC
  Administered 2023-05-08: 1 via ORAL
  Filled 2023-05-08: qty 1

## 2023-05-08 NOTE — Discharge Instructions (Addendum)
It was a pleasure taking care of you today.  As discussed, you have a 2 mm kidney stone which is likely causing your pain.  I have included the number of urology.  Call to schedule an appointment for further evaluation.  I am sending you home with strong pain medication and Flomax to help pass the stone.  Pain medication can cause drowsiness so do not drive or operate machinery.  I have also placed a referral to cardiology.  Expect a phone call within 1 week to schedule an appointment.  Return to the ER for new or worsening symptoms.

## 2023-05-08 NOTE — ED Triage Notes (Signed)
Patient reports left flank pain radiating down to his left groin with emesis this morning , no hematuria , history of kidney stone .

## 2023-05-08 NOTE — ED Provider Notes (Signed)
[facility] Provider Note   CSN: [id number] Arrival date & time: 05/08/23  4098     History  Chief Complaint  Patient presents with   Flank Pain     Hx. Kidney Stone    Edward Mccarty is a 52 y.o. male with a past medical history significant for hypertension, OSA, chronic back pain who presents to the ED due to left flank pain that radiates into left groin that started early this morning.  Denies dysuria and hematuria.  History of kidney stones and notes it feels similar to previous kidney stone.  Patient was able to pass his kidney stone on his own previously.  No fever or chills.  Admits to some lower abdominal pain.  No chest pain or shortness of breath.  History obtained from patient and past medical records. No interpreter used during encounter.       Home Medications Prior to Admission medications   Medication Sig Start Date End Date Taking? Authorizing Provider  HYDROcodone-acetaminophen (NORCO/VICODIN) 5-325 MG tablet Take 1 tablet by mouth every 6 (six) hours as needed. 05/08/23  Yes Bennetta Rudden, Merla Riches, PA-C  tamsulosin (FLOMAX) 0.4 MG CAPS capsule Take 1 capsule (0.4 mg total) by mouth daily for 14 days. 05/08/23 05/22/23 Yes Sreya Froio C, PA-C  fluticasone (FLONASE) 50 MCG/ACT nasal spray PLACE 1 SPRAY INTO BOTH NOSTRILS 2 TIMES DAILY AS NEEDED FOR ALLERGIES OR RHINITIS. Patient not taking: Reported on 12/19/2022 09/01/20   Doreene Nest, NP  lisinopril (ZESTRIL) 20 MG tablet TAKE 1 TABLET BY MOUTH EVERY DAY FOR BLOOD PRESSURE. 11/23/22   Doreene Nest, NP  loratadine (CLARITIN) 10 MG tablet TAKE 1 TABLET BY MOUTH DAILY FOR ALLERGIES 02/07/23   Doreene Nest, NP  sildenafil (VIAGRA) 50 MG tablet Take 1/2 to 1 tablet by mouth 30 minutes prior to intercourse. 03/16/19   Doreene Nest, NP      Allergies    Patient has no known allergies.    Review of Systems   Review of Systems  Respiratory:  Negative  for shortness of breath.   Cardiovascular:  Negative for chest pain.  Gastrointestinal:  Positive for abdominal pain and nausea. Negative for diarrhea and vomiting.  Genitourinary:  Positive for flank pain and testicular pain. Negative for dysuria.  Musculoskeletal:  Positive for back pain.    Physical Exam Updated Vital Signs BP (!) 163/85   Pulse 90   Temp 98 F (36.7 C)   Resp 20   SpO2 100%  Physical Exam Vitals and nursing note reviewed.  Constitutional:      General: He is not in acute distress.    Appearance: He is ill-appearing.     Comments: Appears uncomfortable in bed  HENT:     Head: Normocephalic.  Eyes:     Pupils: Pupils are equal, round, and reactive to light.  Cardiovascular:     Rate and Rhythm: Normal rate and regular rhythm.     Pulses: Normal pulses.     Heart sounds: Normal heart sounds. No murmur heard.    No friction rub. No gallop.  Pulmonary:     Effort: Pulmonary effort is normal.     Breath sounds: Normal breath sounds.  Abdominal:     General: Abdomen is flat. There is no distension.     Palpations: Abdomen is soft.     Tenderness: There is abdominal tenderness. There is no guarding or rebound.     Comments:  Lower abdominal tenderness. TTP throughout left flank region.  Genitourinary:    Comments: GU exam performed with chaperone in room.  No testicular tenderness or edema.  No penile discharge. Musculoskeletal:        General: Normal range of motion.     Cervical back: Neck supple.  Skin:    General: Skin is warm and dry.     Comments: No rash to left flank region  Neurological:     General: No focal deficit present.     Mental Status: He is alert.  Psychiatric:        Mood and Affect: Mood normal.        Behavior: Behavior normal.     ED Results / Procedures / Treatments   Labs (all labs ordered are listed, but only abnormal results are displayed) Labs Reviewed  COMPREHENSIVE METABOLIC PANEL - Abnormal; Notable for the  following components:      Result Value   Sodium 134 (*)    CO2 20 (*)    Glucose, Bld 237 (*)    Creatinine, Ser 1.54 (*)    Calcium 8.8 (*)    GFR, Estimated 55 (*)    All other components within normal limits  URINALYSIS, ROUTINE W REFLEX MICROSCOPIC - Abnormal; Notable for the following components:   Glucose, UA 50 (*)    Hgb urine dipstick MODERATE (*)    All other components within normal limits  CBC WITH DIFFERENTIAL/PLATELET  LIPASE, BLOOD  TROPONIN I (HIGH SENSITIVITY)  TROPONIN I (HIGH SENSITIVITY)    EKG None  Radiology DG Chest Portable 1 View  Result Date: 05/08/2023 CLINICAL DATA:  Chest pain. EXAM: PORTABLE CHEST 1 VIEW COMPARISON:  05/27/2019. FINDINGS: Low lung volumes accentuate the pulmonary vasculature and cardiomediastinal silhouette. Retrocardiac opacity, likely reflects left basilar atelectasis. No consolidation or pulmonary edema. No pleural effusion or pneumothorax. IMPRESSION: Low lung volumes with left basilar atelectasis. Electronically Signed   By: Orvan Falconer M.D.   On: 05/08/2023 09:42   CT Renal Stone Study  Result Date: 05/08/2023 CLINICAL DATA:  Abdominal/flank pain, stone suspected EXAM: CT ABDOMEN AND PELVIS WITHOUT CONTRAST TECHNIQUE: Multidetector CT imaging of the abdomen and pelvis was performed following the standard protocol without IV contrast. RADIATION DOSE REDUCTION: This exam was performed according to the departmental dose-optimization program which includes automated exposure control, adjustment of the mA and/or kV according to patient size and/or use of iterative reconstruction technique. COMPARISON:  CT Enterography 02/03/23. FINDINGS: Lower chest: Lung bases are clear. Note that assessment of the abdominal and pelvic solid organs is limited in the absence of IV contrast. Hepatobiliary: Liver has a normal contour. No perihepatic fluid. Gallbladder is normal in appearance. No focal liver lesions. Pancreas: No evidence of peripancreatic  fat stranding to suggest pancreatitis. Spleen: Normal in size without focal abnormality. Adrenals/Urinary Tract: Bilateral adrenal glands are normal in appearance. The right kidney is normal in appearance. There is mild left-sided hydronephrosis secondary to a small 2 mm stone at the left UVJ (series 3, image 89). The urinary bladder is otherwise normal in appearance without focal abnormality. Stomach/Bowel: No evidence of bowel obstruction. No focal wall thickening. The appendix is normal in appearance. Vascular/Lymphatic: Minimal atherosclerotic vascular calcification. No pelvic, retroperitoneal, or mesenteric lymphadenopathy. Reproductive: Prostate is unremarkable. Incidentally noted are calcifications along the penile musculature. Other: Fat containing right inguinal hernia. No abdominopelvic ascites. Musculoskeletal: No acute or significant osseous findings. IMPRESSION: Mild left-sided hydronephrosis secondary to a small 2 mm stone at the left  UVJ. Aortic Atherosclerosis (ICD10-I70.0). Electronically Signed   By: Lorenza Cambridge M.D.   On: 05/08/2023 07:57    Procedures Procedures    Medications Ordered in ED Medications  HYDROmorphone (DILAUDID) injection 1 mg (1 mg Intravenous Given 05/08/23 0719)  ondansetron (ZOFRAN) injection 4 mg (4 mg Intravenous Given 05/08/23 0719)  sodium chloride 0.9 % bolus 1,000 mL (0 mLs Intravenous Stopped 05/08/23 0918)  ketorolac (TORADOL) 15 MG/ML injection 15 mg (15 mg Intravenous Given 05/08/23 0813)  HYDROmorphone (DILAUDID) injection 1 mg (1 mg Intravenous Given 05/08/23 0919)  oxyCODONE-acetaminophen (PERCOCET/ROXICET) 5-325 MG per tablet 1 tablet (1 tablet Oral Given 05/08/23 1147)    ED Course/ Medical Decision Making/ A&P                             Medical Decision Making Amount and/or Complexity of Data Reviewed Labs: ordered. Decision-making details documented in ED Course. Radiology: ordered and independent interpretation performed. Decision-making  details documented in ED Course.  Risk Prescription drug management.   This patient presents to the ED for concern of flank/groin pain, this involves an extensive number of treatment options, and is a complaint that carries with it a high risk of complications and morbidity.  The differential diagnosis includes kidney stone, pyelonephritis, shingles, testicular torsion, etc   52 year old male presents to the ED due to left flank pain that radiates into left testicle that started early this morning.  History of kidney stones and notes this feels similar.  Patient able to pass his previous kidney stone without intervention.  No hematuria or dysuria.  Upon arrival patient afebrile, not tachycardic or hypoxic.  Patient appears uncomfortable in bed.  Tenderness throughout left flank into lower quadrants.  GU exam performed with chaperone in room.  No testicular tenderness or edema.  Low suspicion for testicular torsion. No rash to suggest shingles. Suspect symptoms related to possible kidney stone.  UA to rule out acute cystitis versus pyelonephritis.  Routine labs ordered.  Dilaudid and Zofran given.  IV fluids.  CBC unremarkable.  No leukocytosis.  Normal hemoglobin.  Lipase normal.  CMP significant for hyperglycemia 237.  No anion gap.  Low suspicion for DKA.  Elevated creatinine 1.54.  IV fluids given.  12:50 PM reassessed patient at bedside.  Patient admits to some improvement in pain however still experiencing significant pain.  Patient requesting a cardiac catheterization.  Patient notes he has been having intermittent chest pain.  Troponin, EKG, and chest x-ray ordered.  Dilaudid given.  CT renal study personally reviewed and interpreted which demonstrates a 2 mm stone at the left UVJ which is likely causing patient's flank and groin pain.  Does demonstrate mild left-sided hydronephrosis.  Awaiting UA results.  Troponin x 2 normal.  EKG demonstrates normal sinus rhythm.  No signs of acute ischemia.   Chest x-ray personally reviewed and interpreted which are negative for signs of pneumonia, pneumothorax, widened mediastinum.  Low suspicion for ACS.  Low suspicion for PE or aortic dissection.  Chest pain appears atypical however, given patient's strong family history we will place an ambulatory referral to cardiology.  Upon reassessment patient admits to some improvement in pain. UA negative for signs of infection. Low suspicion for infected kidney stone. Will discharge patient with Flomax and pain medication.  Urology referral given to patient at discharge advised to call for further evaluation of his kidney stone. Strict ED precautions discussed with patient. Patient states understanding and agrees to  plan. Patient discharged home in no acute distress and stable vitals  Lives at home Has PCP Hx HTN        Final Clinical Impression(s) / ED Diagnoses Final diagnoses:  Kidney stone  Atypical chest pain    Rx / DC Orders ED Discharge Orders          Ordered    Ambulatory referral to Cardiology       Comments: If you have not heard from the Cardiology office within the next 72 hours please call [phone].   05/08/23 1246    tamsulosin (FLOMAX) 0.4 MG CAPS capsule  Daily        05/08/23 1247    HYDROcodone-acetaminophen (NORCO/VICODIN) 5-325 MG tablet  Every 6 hours PRN        05/08/23 1247              Mannie Stabile, PA-C 05/08/23 1306    Tegeler, Canary Brim, MD 05/08/23 1544

## 2023-05-10 ENCOUNTER — Telehealth: Payer: Self-pay

## 2023-05-10 NOTE — Transitions of Care (Post Inpatient/ED Visit) (Signed)
Unable to reach pt by phone and v/m not set up.      05/10/2023  Name: Edward Mccarty MRN: [id number] DOB: 09-16-1971  Today's TOC FU Call Status: Today's TOC FU Call Status:: Unsuccessul Call (1st Attempt) Unsuccessful Call (1st Attempt) Date: 05/10/23  Attempted to reach the patient regarding the most recent Inpatient/ED visit.  Follow Up Plan: Additional outreach attempts will be made to reach the patient to complete the Transitions of Care (Post Inpatient/ED visit) call.   Signature  Lewanda Rife, LPN

## 2023-05-13 NOTE — Transitions of Care (Post Inpatient/ED Visit) (Signed)
   05/13/2023  Voice mail not set up.   Name: Edward Mccarty MRN: [id number] DOB: Oct 11, 1971  Today's TOC FU Call Status: Today's TOC FU Call Status:: Unsuccessful Call (2nd Attempt) Unsuccessful Call (1st Attempt) Date: 05/10/23 Unsuccessful Call (2nd Attempt) Date: 05/13/23  Attempted to reach the patient regarding the most recent Inpatient/ED visit.  Follow Up Plan: Additional outreach attempts will be made to reach the patient to complete the Transitions of Care (Post Inpatient/ED visit) call.   Signature Donnamarie Poag, CMA

## 2023-05-22 ENCOUNTER — Encounter: Payer: Self-pay | Admitting: Gastroenterology

## 2023-05-22 ENCOUNTER — Ambulatory Visit: Payer: BC Managed Care – PPO | Admitting: Gastroenterology

## 2023-05-22 VITALS — BP 130/80 | HR 70 | Ht 72.0 in | Wt 251.2 lb

## 2023-05-22 DIAGNOSIS — K760 Fatty (change of) liver, not elsewhere classified: Secondary | ICD-10-CM | POA: Diagnosis not present

## 2023-05-22 DIAGNOSIS — R079 Chest pain, unspecified: Secondary | ICD-10-CM

## 2023-05-22 DIAGNOSIS — Z6834 Body mass index (BMI) 34.0-34.9, adult: Secondary | ICD-10-CM

## 2023-05-22 DIAGNOSIS — E669 Obesity, unspecified: Secondary | ICD-10-CM

## 2023-05-22 DIAGNOSIS — R6881 Early satiety: Secondary | ICD-10-CM

## 2023-05-22 DIAGNOSIS — Z8719 Personal history of other diseases of the digestive system: Secondary | ICD-10-CM

## 2023-05-22 DIAGNOSIS — M6208 Separation of muscle (nontraumatic), other site: Secondary | ICD-10-CM

## 2023-05-22 NOTE — Patient Instructions (Addendum)
_______________________________________________________  If your blood pressure at your visit was 140/90 or greater, please contact your primary care physician to follow up on this.  _______________________________________________________  If you are age 51 or older, your body mass index should be between 23-30. Your Body mass index is 34.07 kg/m. If this is out of the aforementioned range listed, please consider follow up with your Primary Care Provider.  If you are age 59 or younger, your body mass index should be between 19-25. Your Body mass index is 34.07 kg/m. If this is out of the aformentioned range listed, please consider follow up with your Primary Care Provider.   ________________________________________________________  The [facility] [facility] providers would like to encourage you to use [facility] to communicate with providers for non-urgent requests or questions.  Due to long hold times on the telephone, sending your provider a message by [facility] may be a faster and more efficient way to get a response.  Please allow 48 business hours for a response.  Please remember that this is for non-urgent requests.  _______________________________________________________   Take omeprazole 20 mg daily for a month.   Please follow up in one year.  Thank you for entrusting me with your care and for choosing [facility], Dr. Ileene Patrick

## 2023-05-22 NOTE — Progress Notes (Signed)
HPI : 52 year old male with a history of bowel obstruction, fatty liver, here for follow-up visit to discuss these issues.  I met him in February of this year for screening colonoscopy.  He had 2 small hyperplastic rectal polyps that were removed and no adenomas.  Recommended repeat exam in 10 years.  At the time of that exam he related his history of bowel obstructions to me and asked if he needed further evaluation.  He was admitted to the hospital in July 2020 for his first bowel obstruction.  CT scan showed a partial SBO with a transition point in the right lower quadrant without obvious cause.  He was managed conservatively and discharged.  He states about 2 years after that he had symptoms of another obstruction.  He knew to go on a liquid diet at the time and he states he dealt with it at home for about 4 days and it resolved.  Further, within the past year he had yet another suspected obstruction.  His symptoms are typically severe mid abdominal pain with vomiting and unable to tolerate p.o.  He states he stayed home for about a week on liquid diet until it passed and then he was okay.  In between episodes he has not had any pains.  He had discussed this with me at the time of his colonoscopy.  I recommended a follow-up CT enterography which he had done in March.  The small intestine was normal without any concerning pathology.  He was noted to have fatty liver with hepatomegaly on that exam and tiny renal stone.  At baseline he does have some early satiety has been ongoing for a few years now.  He states he does not eat healthy, eats hamburgers at [facility] almost every day.  He states he cannot move as much as he used to due to the symptoms.  He states he feels as if he "ate at [employer]" after most of his meals at [employer].  He does not have any vomiting but does have occasional nausea.  No reflux.  No abdominal pains with this.  Symptoms eventually passed in regards to the fullness  feeling.  He states he has gained about 30 pounds in the past year.  He reports he has no desire to change his eating to be "fat and happy".  He denies any family history of liver disease.  He does not drink any alcohol routinely, has been drink perhaps once or twice per year.  Of note he states his mother and father have both had a CABG in addition his brother who had one more recently.  He does have some chest discomfort when he exerts himself, such as mowing the lawn or doing something to get his heart rate up.  This was noted by his primary care and he has been referred to see cardiology for further workup.  His mother had a major operation from ischemic bowel from what he reports.  Otherwise, he has protuberance in his upper abdomen when he sits up and inquires about what that is due for.  He has not pain from the site and has been stable over time  Prior workup: CT abdomen / pelvis 05/27/2019: IMPRESSION: Partial small bowel obstruction. Transition point noted in the right mid abdomen without obvious cause. Fatty infiltration of the liver.   CT enterography 02/04/2023: IMPRESSION: 1. No acute findings. 2. Steatotic enlarged liver. 3. Tiny left renal stone.  CT renal stone 05/08/23: IMPRESSION: Mild left-sided hydronephrosis secondary  to a small 2 mm stone at the left UVJ.   Colonoscopy 01/11/2023: - The perianal and digital rectal examinations were normal. - Two sessile polyps were found in the rectum. The polyps were 3 to 4 mm in size. These polyps were removed with a cold snare. Resection and retrieval were complete. - Internal hemorrhoids were found during retroflexion. - The exam was otherwise without abnormality.  Surgical [P], colon, rectum, polyp (2) HYPERPLASTIC POLYP (2).    Lab Results  Component Value Date   ALT 34 05/08/2023   AST 26 05/08/2023   ALKPHOS 57 05/08/2023   BILITOT 1.2 05/08/2023    Fibrosis 4 Score = .96 (Low risk)        Interpretation for  patients with NAFLD          <1.30       -  F0-F1 (Low risk)          1.30-2.67 -  Indeterminate           >2.67      -  F3-F4 (High risk)     Validated for ages 64-65    Past Medical History:  Diagnosis Date   AKI (acute kidney injury) (HCC) 05/28/2019   Arthritis    generalized   Heart murmur    Hypertension    on meds   OSA (obstructive sleep apnea) 05/15/2018   does not use CPAP- has machine   Partial small bowel obstruction (HCC) 05/27/2019   Peyronie's disease    Seasonal allergies      Past Surgical History:  Procedure Laterality Date   MULTIPLE TOOTH EXTRACTIONS     did not receive anethesia- had all teeth removed- has upper and lower dentures   Family History  Problem Relation Age of Onset   Hypertension Mother    Stroke Mother    Heart disease Mother    Hypertension Father    Stroke Father    Diabetes Father    Heart disease Father    Bladder Cancer Father    Diabetes Brother    Lung cancer Paternal Grandmother    Colon polyps Neg Hx    Colon cancer Neg Hx    Esophageal cancer Neg Hx    Stomach cancer Neg Hx    Rectal cancer Neg Hx    Social History   Tobacco Use   Smoking status: Former    Packs/day: 0.25    Years: 4.00    Additional pack years: 0.00    Total pack years: 1.00    Types: Cigarettes    Quit date: 05/21/1991    Years since quitting: 32.0   Smokeless tobacco: Never  Vaping Use   Vaping Use: Never used  Substance Use Topics   Alcohol use: No   Drug use: Not Currently    Types: Marijuana   Current Outpatient Medications  Medication Sig Dispense Refill   lisinopril (ZESTRIL) 20 MG tablet TAKE 1 TABLET BY MOUTH EVERY DAY FOR BLOOD PRESSURE. 90 tablet 3   loratadine (CLARITIN) 10 MG tablet TAKE 1 TABLET BY MOUTH DAILY FOR ALLERGIES 90 tablet 2   No current facility-administered medications for this visit.   No Known Allergies   Review of Systems: All systems reviewed and negative except where noted in HPI.    DG Chest  Portable 1 View  Result Date: 05/08/2023 CLINICAL DATA:  Chest pain. EXAM: PORTABLE CHEST 1 VIEW COMPARISON:  05/27/2019. FINDINGS: Low lung volumes accentuate the pulmonary vasculature and cardiomediastinal silhouette. Retrocardiac  opacity, likely reflects left basilar atelectasis. No consolidation or pulmonary edema. No pleural effusion or pneumothorax. IMPRESSION: Low lung volumes with left basilar atelectasis. Electronically Signed   By: Orvan Falconer M.D.   On: 05/08/2023 09:42   CT Renal Stone Study  Result Date: 05/08/2023 CLINICAL DATA:  Abdominal/flank pain, stone suspected EXAM: CT ABDOMEN AND PELVIS WITHOUT CONTRAST TECHNIQUE: Multidetector CT imaging of the abdomen and pelvis was performed following the standard protocol without IV contrast. RADIATION DOSE REDUCTION: This exam was performed according to the departmental dose-optimization program which includes automated exposure control, adjustment of the mA and/or kV according to patient size and/or use of iterative reconstruction technique. COMPARISON:  CT Enterography 02/03/23. FINDINGS: Lower chest: Lung bases are clear. Note that assessment of the abdominal and pelvic solid organs is limited in the absence of IV contrast. Hepatobiliary: Liver has a normal contour. No perihepatic fluid. Gallbladder is normal in appearance. No focal liver lesions. Pancreas: No evidence of peripancreatic fat stranding to suggest pancreatitis. Spleen: Normal in size without focal abnormality. Adrenals/Urinary Tract: Bilateral adrenal glands are normal in appearance. The right kidney is normal in appearance. There is mild left-sided hydronephrosis secondary to a small 2 mm stone at the left UVJ (series 3, image 89). The urinary bladder is otherwise normal in appearance without focal abnormality. Stomach/Bowel: No evidence of bowel obstruction. No focal wall thickening. The appendix is normal in appearance. Vascular/Lymphatic: Minimal atherosclerotic vascular  calcification. No pelvic, retroperitoneal, or mesenteric lymphadenopathy. Reproductive: Prostate is unremarkable. Incidentally noted are calcifications along the penile musculature. Other: Fat containing right inguinal hernia. No abdominopelvic ascites. Musculoskeletal: No acute or significant osseous findings. IMPRESSION: Mild left-sided hydronephrosis secondary to a small 2 mm stone at the left UVJ. Aortic Atherosclerosis (ICD10-I70.0). Electronically Signed   By: Lorenza Cambridge M.D.   On: 05/08/2023 07:57    Physical Exam: BP 130/80   Pulse 70   Ht 6' (1.829 m)   Wt 251 lb 3.2 oz (113.9 kg)   BMI 34.07 kg/m  Constitutional: Pleasant,well-developed, male in no acute distress. HEENT: Normocephalic and atraumatic. Conjunctivae are normal. No scleral icterus. Neck supple.  Cardiovascular: Normal rate, regular rhythm.  Pulmonary/chest: Effort normal and breath sounds normal. No wheezing, rales or rhonchi. Abdominal: Soft, nondistended, nontender. (+) diastasis recti. . There are no masses palpable. No hepatomegaly. Extremities: no edema Lymphadenopathy: No cervical adenopathy noted. Neurological: Alert and oriented to person place and time. Skin: Skin is warm and dry. No rashes noted. Psychiatric: Normal mood and affect. Behavior is normal.   ASSESSMENT: 52 y.o. male here for assessment of the following  1. History of small bowel obstruction   2. Early satiety   3. Fatty liver   4. Class 1 obesity with body mass index (BMI) of 34.0 to 34.9 in adult, unspecified obesity type, unspecified whether serious comorbidity present   5. Chest pain, unspecified type   6. Diastasis recti    Multiple issues addressed today.    Patient had hospitalization for his first bowel obstruction in 2020 without clear etiology.  He has had subjective symptoms of obstruction on 2 occasions since that time, he has liquid diet and stays home for upwards of a week in the past to deal with this.  CT enterography  done in March showed normal small bowel without any clear cause for this.  He has no prior operations in his abdomen, unclear what has caused these suspected recurrent obstructions.  We discussed that if this happens again it would be  good to have imaging to confirm at the time of symptoms if he is having obstructions and to help localize it.  He is agreeable to this and will let me know if and when symptoms recur.  I offered him referral to see a surgeon about this in the interim and he declines at this time.  I reviewed fatty liver noted on imaging for a few years now since his first obstruction.  His LFTs are normal.  His diet is not healthy, reportedly eating fast food every day and has gained weight over the past year.  Counseled him on risks of cirrhosis with fatty liver moving forward.  He is not drinking alcohol.  Ultimately recommend dieting with weight loss.  We discussed this for a bit, I offered him referral to nutritionist or weight loss center and he is not interested in that at this time although he understands my recommendation and will try to eat more healthy.  He should have his liver function test at least once yearly.  Fib 4 is normal/low risk.  Patient otherwise having some intermittent early satiety, usually with heavier meals.  He has no other upper tract symptoms.  No reflux.  We discussed doing an EGD to evaluate this however given his exertional chest pain and family history of CAD, he needs to see cardiology first to have that further evaluated prior to pursuing any elective endoscopic evaluation.  He agrees and will await his cardiology evaluation.  In the interim we will start omeprazole 20 mg once daily for 1 month trial to see if that will help.  Finally, he has diastases recti noted on physical exam.  Counseled him on what this is.  Do not recommend surgical evaluation at this time, he will keep an eye on it and let me know if this worsens.   PLAN: - go to the ED or call us  for imaging if recurrent symptoms of obstruction - discussed surgical referral but he declines for now - discussed possible EGD to evaluate early satiety, but needs to see cardiology first - start omeprazole 20mg  / day for 1 month trial - await referral to see cardiology for chest pain and FH of CAD - discussed fatty liver, risks for cirrhosis. ALT normal - discussed diet / weight loss - he understands but does not seem motivated to make change - discussed diastasis recti, does not warrant surgery, small, he will keep an eye on this - f/u one year or sooner with issues. Contact me after cardiology evaluation if he wishes to pursue EGD  Harlin Rain, MD [facility]

## 2023-07-22 ENCOUNTER — Ambulatory Visit: Payer: BC Managed Care – PPO | Admitting: Internal Medicine

## 2023-07-23 ENCOUNTER — Ambulatory Visit: Payer: BC Managed Care – PPO | Attending: Cardiology | Admitting: Cardiology

## 2023-07-23 ENCOUNTER — Encounter: Payer: Self-pay | Admitting: Cardiology

## 2023-07-23 VITALS — BP 134/88 | HR 90 | Ht 72.0 in | Wt 246.0 lb

## 2023-07-23 DIAGNOSIS — R072 Precordial pain: Secondary | ICD-10-CM

## 2023-07-23 DIAGNOSIS — I10 Essential (primary) hypertension: Secondary | ICD-10-CM | POA: Diagnosis not present

## 2023-07-23 DIAGNOSIS — Z01812 Encounter for preprocedural laboratory examination: Secondary | ICD-10-CM

## 2023-07-23 MED ORDER — METOPROLOL TARTRATE 100 MG PO TABS: 100.0000 mg | ORAL_TABLET | ORAL | 0 refills | Status: DC

## 2023-07-23 NOTE — Patient Instructions (Signed)
Medication Instructions:  The current medical regimen is effective;  continue present plan and medications.  *If you need a refill on your cardiac medications before your next appointment, please call your pharmacy*   Lab Work: Please have blood work today  (BMP)  If you have labs (blood work) drawn today and your tests are completely normal, you will receive your results only by: MyChart Message (if you have MyChart) OR A paper copy in the mail If you have any lab test that is abnormal or we need to change your treatment, we will call you to review the results.   Testing/Procedures:   Your cardiac CT will be scheduled at:   [facility] [address] [place], [place] [address] [phone]  Please arrive at the [facility] (Entrance C2) of [facility] 30 minutes prior to test start time. You can use the FREE valet parking offered at entrance C (encouraged to control the heart rate for the test)  Proceed to the [facility] Radiology Department (first floor) to check-in and test prep.  All radiology patients and guests should use entrance C2 at [facility], accessed from [facility], even though the hospital's physical address listed is [address]     Please follow these instructions carefully (unless otherwise directed):  An IV will be required for this test and Nitroglycerin will be given.  Hold all erectile dysfunction medications at least 3 days (72 hrs) prior to test. (Ie viagra, cialis, sildenafil, tadalafil, etc)   On the Night Before the Test: Be sure to Drink plenty of water. Do not consume any caffeinated/decaffeinated beverages or chocolate 12 hours prior to your test. Do not take any antihistamines 12 hours prior to your test.  On the Day of the Test: Drink plenty of water until 1 hour prior to the test. Do not eat any food 1 hour prior to test. You may take your regular medications  prior to the test. HOLD LISINOPRIL THIS AM. Take metoprolol (Lopressor) two hours prior to test. If you take Furosemide/Hydrochlorothiazide/Spironolactone, please HOLD on the morning of the test.      After the Test: Drink plenty of water. After receiving IV contrast, you may experience a mild flushed feeling. This is normal. On occasion, you may experience a mild rash up to 24 hours after the test. This is not dangerous. If this occurs, you can take Benadryl 25 mg and increase your fluid intake. If you experience trouble breathing, this can be serious. If it is severe call 911 IMMEDIATELY. If it is mild, please call our office.  We will call to schedule your test 2-4 weeks out understanding that some insurance companies will need an authorization prior to the service being performed.   For more information and frequently asked questions, please visit our website : [email or web address]  For non-scheduling related questions, please contact the cardiac imaging nurse navigator should you have any questions/concerns: Cardiac Imaging Nurse Navigators Direct Office Dial: [phone]   For scheduling needs, including cancellations and rescheduling, please call [place], [phone].   Follow-Up: At [facility], you and your health needs are our priority.  As part of our continuing mission to provide you with exceptional heart care, we have created designated Provider Care Teams.  These Care Teams include your primary Cardiologist (physician) and Advanced Practice Providers (APPs -  Physician Assistants and Nurse Practitioners) who all work together to provide you with the care you need, when you  need it.  We recommend signing up for the patient portal called "MyChart".  Sign up information is provided on this After Visit Summary.  MyChart is used to connect with patients for Virtual Visits (Telemedicine).  Patients are able to view lab/test results, encounter notes, upcoming  appointments, etc.  Non-urgent messages can be sent to your provider as well.   To learn more about what you can do with MyChart, go to [email or web address].    Your next appointment:   Follow up will be based on the results of the above testing.

## 2023-07-23 NOTE — Progress Notes (Signed)
Cardiology Office Note:    Date:  07/23/2023   ID:  Edward Mccarty, DOB Jun 19, 1971, MRN [id number]  PCP:  Doreene Nest, NP   [facility] Providers Cardiologist:  None     Referring MD: Mannie Stabile, PA*    History of Present Illness:    Edward Mccarty is a 52 y.o. male Discussed the use of AI scribe software for clinical note transcription with the patient, who gave verbal consent to proceed.  History of Present Illness   A 52 year old patient presents with significant chest discomfort, particularly noticeable during physical exertion such as mowing the lawn. The discomfort, described as angina-like, dissipates upon rest, no radiation. The patient reports a strong family history of coronary artery disease, with both parents and a brother having undergone coronary artery bypass grafting (CABG). The patient has a history of hypertension, managed with Lisinopril 20mg  daily, and was diagnosed with sleep apnea in 2019, but is not currently using the prescribed machine. The patient admits to a poor diet, rich in fatty meats and lacking in fruits and vegetables, and a stressful lifestyle. The patient also reports feeling tired easily, which he attributes to lack of sleep and stress. The patient has a history of smoking and drinking in his youth, but has since quit both habits. The patient's most recent LDL was 127, and his hemoglobin A1c was 6.2, indicating a prediabetic state.       - Patient does not smoke - Patient drinks alcohol once or twice a year  - Father, [name] we take care of, had coronary artery disease - Mother had coronary artery disease - Brother had coronary artery disease  Past Medical History:  Diagnosis Date   AKI (acute kidney injury) (HCC) 05/28/2019   Arthritis    generalized   Heart murmur    Hypertension    on meds   OSA (obstructive sleep apnea) 05/15/2018   does not use CPAP- has machine   Partial small bowel obstruction (HCC)  05/27/2019   Peyronie's disease    Seasonal allergies     Past Surgical History:  Procedure Laterality Date   MULTIPLE TOOTH EXTRACTIONS     did not receive anethesia- had all teeth removed- has upper and lower dentures    Current Medications: Current Meds  Medication Sig   lisinopril (ZESTRIL) 20 MG tablet TAKE 1 TABLET BY MOUTH EVERY DAY FOR BLOOD PRESSURE.   loratadine (CLARITIN) 10 MG tablet TAKE 1 TABLET BY MOUTH DAILY FOR ALLERGIES   metoprolol tartrate (LOPRESSOR) 100 MG tablet Take 1 tablet (100 mg total) by mouth as directed. Take one tablet (2) hours before your CT scan     Allergies:   Patient has no known allergies.   Social History   Socioeconomic History   Marital status: Married    Spouse name: Not on file   Number of children: Not on file   Years of education: Not on file   Highest education level: Not on file  Occupational History   Not on file  Tobacco Use   Smoking status: Former    Current packs/day: 0.00    Average packs/day: 0.3 packs/day for 4.0 years (1.0 ttl pk-yrs)    Types: Cigarettes    Start date: 05/21/1987    Quit date: 05/21/1991    Years since quitting: 32.1   Smokeless tobacco: Never  Vaping Use   Vaping status: Never Used  Substance and Sexual Activity   Alcohol use: No   Drug  use: Not Currently    Types: Marijuana   Sexual activity: Yes  Other Topics Concern   Not on file  Social History Narrative   Married.   3 children.   Works as an International aid/development worker.   Enjoys working out, sleeping, relaxing.    Social Determinants of Health   Financial Resource Strain: Not on file  Food Insecurity: Not on file  Transportation Needs: Not on file  Physical Activity: Not on file  Stress: Not on file  Social Connections: Not on file     Family History: The patient's family history includes Bladder Cancer in his father; Diabetes in his brother and father; Heart disease in his father and mother; Hypertension in his father and mother;  Lung cancer in his paternal grandmother; Stroke in his father and mother. There is no history of Colon polyps, Colon cancer, Esophageal cancer, Stomach cancer, or Rectal cancer.  ROS:   Please see the history of present illness.    No syncope, no SOB, no bleeding All other systems reviewed and are negative.  EKGs/Labs/Other Studies Reviewed:    The following studies were reviewed today: LABS Creatinine: 1.54 (04/2023) LDL: 127 ALT: normal Creatinine: 1.25 (10/2022) Creatinine: 1.4 (2021) LDL: 94 (10/2022) Hemoglobin A1c: 6.2  DIAGNOSTIC EKG: Normal sinus rhythm, T wave inversion in inferior leads, subtle ST segment sagging in V4-V6 (05/09/2023)    EKG:  As above   Recent Labs: 05/08/2023: ALT 34; BUN 16; Creatinine, Ser 1.54; Hemoglobin 14.4; Platelets 233; Potassium 3.7; Sodium 134  Recent Lipid Panel    Component Value Date/Time   CHOL 166 11/21/2022 1045   TRIG 179.0 (H) 11/21/2022 1045   HDL 36.30 (L) 11/21/2022 1045   CHOLHDL 5 11/21/2022 1045   VLDL 35.8 11/21/2022 1045   LDLCALC 94 11/21/2022 1045   LDLDIRECT 127.0 10/10/2021 0855     Risk Assessment/Calculations:               Physical Exam:    VS:  BP 134/88   Pulse 90   Ht 6' (1.829 m)   Wt 246 lb (111.6 kg)   SpO2 97%   BMI 33.36 kg/m     Wt Readings from Last 3 Encounters:  07/23/23 246 lb (111.6 kg)  05/22/23 251 lb 3.2 oz (113.9 kg)  01/11/23 235 lb (106.6 kg)     GEN:  Well nourished, well developed in no acute distress HEENT: Normal NECK: No JVD; No carotid bruits LYMPHATICS: No lymphadenopathy CARDIAC: RRR, no murmurs, rubs, gallops RESPIRATORY:  Clear to auscultation without rales, wheezing or rhonchi  ABDOMEN: Soft, non-tender, non-distended MUSCULOSKELETAL:  No edema; No deformity  SKIN: Warm and dry NEUROLOGIC:  Alert and oriented x 3 PSYCHIATRIC:  Normal affect   ASSESSMENT:    1. Precordial pain   2. Essential hypertension   3. Pre-procedure lab exam    PLAN:     In order of problems listed above:  Assessment and Plan    Chest Discomfort/angina Exertional chest discomfort with strong family history of coronary artery disease. EKG showed T wave inversion in the inferior leads and subtle ST segment sagging in V4 through V6. -Order coronary CT scan to evaluate for coronary artery disease. Metoprolol 100mg  prior -Consider starting Crestor if plaque is identified on CT scan. -hospital precautions if symptoms worsen  Hypertension Well controlled on Lisinopril 20mg  daily. -Continue current medication regimen.  Prediabetes Hemoglobin A1C of 6.2. -Encourage lifestyle modifications including diet changes (consider Mediterranean diet).  Sleep Apnea Diagnosed in  2019 but not currently using CPAP machine. -Encourage patient to reach out to sleep specialist for re-evaluation and potential treatment.  Chronic Kidney Disease Mildly elevated creatinine (1.54). -Monitor kidney function, especially in the context of contrast use for coronary CT scan. -Encourage hydration. Hold lisinopril a day prior to CT.   General Health Maintenance -Encourage lifestyle modifications including diet changes and regular exercise. -Consider re-evaluation for sleep apnea.              Medication Adjustments/Labs and Tests Ordered: Current medicines are reviewed at length with the patient today.  Concerns regarding medicines are outlined above.  Orders Placed This Encounter  Procedures   CT CORONARY MORPH W/CTA COR W/SCORE W/CA W/CM &/OR WO/CM   Basic metabolic panel   EKG 12-Lead   EKG 12-Lead   Meds ordered this encounter  Medications   metoprolol tartrate (LOPRESSOR) 100 MG tablet    Sig: Take 1 tablet (100 mg total) by mouth as directed. Take one tablet (2) hours before your CT scan    Dispense:  1 tablet    Refill:  0    Patient Instructions  Medication Instructions:  The current medical regimen is effective;  continue present plan and  medications.  *If you need a refill on your cardiac medications before your next appointment, please call your pharmacy*   Lab Work: Please have blood work today  (BMP)  If you have labs (blood work) drawn today and your tests are completely normal, you will receive your results only by: MyChart Message (if you have MyChart) OR A paper copy in the mail If you have any lab test that is abnormal or we need to change your treatment, we will call you to review the results.   Testing/Procedures:   Your cardiac CT will be scheduled at:   [facility] [address] [place], [place] [address] [phone]  Please arrive at the [facility] and Children's Entrance (Entrance C2) of [facility] 30 minutes prior to test start time. You can use the FREE valet parking offered at entrance C (encouraged to control the heart rate for the test)  Proceed to the [facility] Radiology Department (first floor) to check-in and test prep.  All radiology patients and guests should use entrance C2 at [facility], accessed from [facility], even though the hospital's physical address listed is [address]     Please follow these instructions carefully (unless otherwise directed):  An IV will be required for this test and Nitroglycerin will be given.  Hold all erectile dysfunction medications at least 3 days (72 hrs) prior to test. (Ie viagra, cialis, sildenafil, tadalafil, etc)   On the Night Before the Test: Be sure to Drink plenty of water. Do not consume any caffeinated/decaffeinated beverages or chocolate 12 hours prior to your test. Do not take any antihistamines 12 hours prior to your test.  On the Day of the Test: Drink plenty of water until 1 hour prior to the test. Do not eat any food 1 hour prior to test. You may take your regular medications prior to the test. HOLD LISINOPRIL THIS AM. Take metoprolol (Lopressor) two hours prior to  test. If you take Furosemide/Hydrochlorothiazide/Spironolactone, please HOLD on the morning of the test.      After the Test: Drink plenty of water. After receiving IV contrast, you may experience a mild flushed feeling. This is normal. On occasion, you may experience a mild rash up to 24  hours after the test. This is not dangerous. If this occurs, you can take Benadryl 25 mg and increase your fluid intake. If you experience trouble breathing, this can be serious. If it is severe call 911 IMMEDIATELY. If it is mild, please call our office.  We will call to schedule your test 2-4 weeks out understanding that some insurance companies will need an authorization prior to the service being performed.   For more information and frequently asked questions, please visit our website : [email or web address]  For non-scheduling related questions, please contact the cardiac imaging nurse navigator should you have any questions/concerns: Cardiac Imaging Nurse Navigators Direct Office Dial: [phone]   For scheduling needs, including cancellations and rescheduling, please call [place], [phone].   Follow-Up: At [facility], you and your health needs are our priority.  As part of our continuing mission to provide you with exceptional heart care, we have created designated Provider Care Teams.  These Care Teams include your primary Cardiologist (physician) and Advanced Practice Providers (APPs -  Physician Assistants and Nurse Practitioners) who all work together to provide you with the care you need, when you need it.  We recommend signing up for the patient portal called "MyChart".  Sign up information is provided on this After Visit Summary.  MyChart is used to connect with patients for Virtual Visits (Telemedicine).  Patients are able to view lab/test results, encounter notes, upcoming appointments, etc.  Non-urgent messages can be sent to your provider as well.   To learn  more about what you can do with MyChart, go to [email or web address].    Your next appointment:   Follow up will be based on the results of the above testing.    Signed, Donato Schultz, MD  07/23/2023 9:42 AM    [facility]

## 2023-07-24 LAB — BASIC METABOLIC PANEL
BUN/Creatinine Ratio: 8 — ABNORMAL LOW (ref 9–20)
BUN: 11 mg/dL (ref 6–24)
CO2: 23 mmol/L (ref 20–29)
Calcium: 9.4 mg/dL (ref 8.7–10.2)
Chloride: 103 mmol/L (ref 96–106)
Creatinine, Ser: 1.4 mg/dL — ABNORMAL HIGH (ref 0.76–1.27)
Glucose: 149 mg/dL — ABNORMAL HIGH (ref 70–99)
Potassium: 4.1 mmol/L (ref 3.5–5.2)
Sodium: 139 mmol/L (ref 134–144)
eGFR: 61 mL/min/{1.73_m2} (ref >59)

## 2023-07-31 ENCOUNTER — Telehealth (HOSPITAL_COMMUNITY): Payer: Self-pay | Admitting: *Deleted

## 2023-07-31 NOTE — Telephone Encounter (Signed)
Attempted to call patient regarding upcoming cardiac CT appointment. Unable to leave VM (VM not set up). Johney Frame RN Navigator Cardiac Imaging [facility] [phone] Office

## 2023-08-01 ENCOUNTER — Ambulatory Visit (HOSPITAL_COMMUNITY)
Admission: RE | Admit: 2023-08-01 | Discharge: 2023-08-01 | Disposition: A | Discharge status: Home / Self Care | Payer: BC Managed Care – PPO | Source: Ambulatory Visit | Attending: Cardiology | Admitting: Cardiology

## 2023-08-01 ENCOUNTER — Ambulatory Visit (HOSPITAL_BASED_OUTPATIENT_CLINIC_OR_DEPARTMENT_OTHER)
Admission: RE | Admit: 2023-08-01 | Discharge: 2023-08-01 | Disposition: A | Discharge status: Home / Self Care | Payer: BC Managed Care – PPO | Source: Ambulatory Visit | Attending: Internal Medicine | Admitting: Internal Medicine

## 2023-08-01 VITALS — BP 111/73 | HR 72

## 2023-08-01 DIAGNOSIS — R931 Abnormal findings on diagnostic imaging of heart and coronary circulation: Secondary | ICD-10-CM

## 2023-08-01 DIAGNOSIS — R072 Precordial pain: Secondary | ICD-10-CM | POA: Insufficient documentation

## 2023-08-01 DIAGNOSIS — I2510 Atherosclerotic heart disease of native coronary artery without angina pectoris: Secondary | ICD-10-CM | POA: Diagnosis not present

## 2023-08-01 MED ORDER — IOHEXOL 350 MG/ML SOLN
100.0000 mL | Freq: Once | INTRAVENOUS | Status: AC | PRN
  Administered 2023-08-01: 100 mL via INTRAVENOUS

## 2023-08-01 MED ORDER — NITROGLYCERIN 0.4 MG SL SUBL
SUBLINGUAL_TABLET | SUBLINGUAL | Status: AC
  Filled 2023-08-01: qty 2

## 2023-08-01 MED ORDER — NITROGLYCERIN 0.4 MG SL SUBL
0.8000 mg | SUBLINGUAL_TABLET | Freq: Once | SUBLINGUAL | Status: AC
  Administered 2023-08-01: 0.8 mg via SUBLINGUAL

## 2023-08-08 ENCOUNTER — Other Ambulatory Visit: Payer: Self-pay | Admitting: *Deleted

## 2023-08-08 MED ORDER — ROSUVASTATIN CALCIUM 10 MG PO TABS: 10.0000 mg | ORAL_TABLET | Freq: Every day | ORAL | 3 refills | Status: DC

## 2023-12-13 ENCOUNTER — Encounter: Payer: Self-pay | Admitting: Primary Care

## 2023-12-13 ENCOUNTER — Ambulatory Visit (INDEPENDENT_AMBULATORY_CARE_PROVIDER_SITE_OTHER)
Admission: RE | Admit: 2023-12-13 | Discharge: 2023-12-13 | Disposition: A | Discharge status: Home / Self Care | Payer: BC Managed Care – PPO | Source: Ambulatory Visit | Attending: Primary Care

## 2023-12-13 ENCOUNTER — Ambulatory Visit (INDEPENDENT_AMBULATORY_CARE_PROVIDER_SITE_OTHER): Payer: BC Managed Care – PPO | Admitting: Primary Care

## 2023-12-13 VITALS — BP 132/80 | HR 88 | Temp 98.1°F | Ht 72.0 in | Wt 252.0 lb

## 2023-12-13 DIAGNOSIS — M47816 Spondylosis without myelopathy or radiculopathy, lumbar region: Secondary | ICD-10-CM | POA: Diagnosis not present

## 2023-12-13 DIAGNOSIS — Z0000 Encounter for general adult medical examination without abnormal findings: Secondary | ICD-10-CM | POA: Diagnosis not present

## 2023-12-13 DIAGNOSIS — I10 Essential (primary) hypertension: Secondary | ICD-10-CM

## 2023-12-13 DIAGNOSIS — Z125 Encounter for screening for malignant neoplasm of prostate: Secondary | ICD-10-CM | POA: Diagnosis not present

## 2023-12-13 DIAGNOSIS — M545 Low back pain, unspecified: Secondary | ICD-10-CM

## 2023-12-13 DIAGNOSIS — G4733 Obstructive sleep apnea (adult) (pediatric): Secondary | ICD-10-CM

## 2023-12-13 DIAGNOSIS — R7303 Prediabetes: Secondary | ICD-10-CM | POA: Diagnosis not present

## 2023-12-13 DIAGNOSIS — G8929 Other chronic pain: Secondary | ICD-10-CM

## 2023-12-13 DIAGNOSIS — Z0001 Encounter for general adult medical examination with abnormal findings: Secondary | ICD-10-CM

## 2023-12-13 DIAGNOSIS — E785 Hyperlipidemia, unspecified: Secondary | ICD-10-CM | POA: Diagnosis not present

## 2023-12-13 LAB — CBC
HCT: 45 % (ref 39.0–52.0)
Hemoglobin: 15.4 g/dL (ref 13.0–17.0)
MCHC: 34.1 g/dL (ref 30.0–36.0)
MCV: 86.6 fL (ref 78.0–100.0)
Platelets: 258 10*3/uL (ref 150.0–400.0)
RBC: 5.2 Mil/uL (ref 4.22–5.81)
RDW: 12.6 % (ref 11.5–15.5)
WBC: 6.7 10*3/uL (ref 4.0–10.5)

## 2023-12-13 LAB — COMPREHENSIVE METABOLIC PANEL
ALT: 24 U/L (ref 0–53)
AST: 17 U/L (ref 0–37)
Albumin: 4.4 g/dL (ref 3.5–5.2)
Alkaline Phosphatase: 69 U/L (ref 39–117)
BUN: 16 mg/dL (ref 6–23)
CO2: 30 meq/L (ref 19–32)
Calcium: 9.3 mg/dL (ref 8.4–10.5)
Chloride: 102 meq/L (ref 96–112)
Creatinine, Ser: 1.48 mg/dL (ref 0.40–1.50)
GFR: 54.51 mL/min — ABNORMAL LOW (ref >60.00)
Glucose, Bld: 153 mg/dL — ABNORMAL HIGH (ref 70–99)
Potassium: 4.3 meq/L (ref 3.5–5.1)
Sodium: 139 meq/L (ref 135–145)
Total Bilirubin: 1 mg/dL (ref 0.2–1.2)
Total Protein: 7.6 g/dL (ref 6.0–8.3)

## 2023-12-13 LAB — LIPID PANEL
Cholesterol: 166 mg/dL (ref 0–200)
HDL: 36.7 mg/dL — ABNORMAL LOW (ref >39.00)
LDL Cholesterol: 94 mg/dL (ref 0–99)
NonHDL: 129.07
Total CHOL/HDL Ratio: 5
Triglycerides: 177 mg/dL — ABNORMAL HIGH (ref 0.0–149.0)
VLDL: 35.4 mg/dL (ref 0.0–40.0)

## 2023-12-13 LAB — HEMOGLOBIN A1C: Hgb A1c MFr Bld: 6.7 % — ABNORMAL HIGH (ref 4.6–6.5)

## 2023-12-13 LAB — PSA: PSA: 1.58 ng/mL (ref 0.10–4.00)

## 2023-12-13 MED ORDER — GABAPENTIN 300 MG PO CAPS: 300.0000 mg | ORAL_CAPSULE | Freq: Every day | ORAL | 0 refills | Status: DC

## 2023-12-13 NOTE — Assessment & Plan Note (Signed)
Continue rosuvastatin 10 mg daily. ?Repeat lipid panel pending. ?

## 2023-12-13 NOTE — Assessment & Plan Note (Signed)
Declines Shingrix and influenza vaccines Colonoscopy UTD, due 2034 PSA due and pending.  Discussed the importance of a healthy diet and regular exercise in order for weight loss, and to reduce the risk of further co-morbidity.  Exam stable. Labs pending.  Follow up in 1 year for repeat physical.

## 2023-12-13 NOTE — Assessment & Plan Note (Signed)
Continued, seems to be progressing. No alarm signs on exam or HPI.  Checking plain films of the lumbar spine today. Referral placed to orthopedics for further evaluation.  Start gabapentin 300 mg at bedtime.  Drowsiness precautions provided.

## 2023-12-13 NOTE — Progress Notes (Signed)
Subjective:    Patient ID: Edward Mccarty, male    DOB: [birth year], 53 y.o.   MRN: [id number]  HPI  Edward Mccarty is a very pleasant 53 y.o. male who presents today for complete physical and follow up of chronic conditions.  He would also like to discuss back pain. Chronic history of back pain. Has worked in positions to where he lifts heavy objects. Now he is a Restaurant manager, fast food position and does not lift heavy objects as frequently. Evaluated years ago several times for his ongoing back pain, completed two MRIs in the past, physical therapy and surgery were recommended. He had no time for physical therapy at the time so he never completed surgery.   His pain is located to the mid lower back. He also has to constantly move around his left leg, especially when laying in bed at night. He's been taking hydrocodone-acetaminophen 10-325 from an older prescription at home, has been taking sparingly for severe pain. He's tried OTC sleeping pills, Aleve, Tylenol.   He denies radiation of pain down his lower extremities, numbness to bilateral lower extremities.   Immunizations: -Tetanus: Completed in 2018 -Influenza: Declines influenza vaccine.  -Shingles: Never completed, declines today   Diet: Fair diet.  Exercise: No regular exercise.  Eye exam: Completed years ago  Dental exam: Completed years ago  Colonoscopy: Completed in 2024, due 2034  PSA: Due  BP Readings from Last 3 Encounters:  12/13/23 132/80  08/01/23 111/73  07/23/23 134/88         Review of Systems  Constitutional:  Negative for unexpected weight change.  HENT:  Negative for rhinorrhea.   Respiratory:  Negative for cough and shortness of breath.   Cardiovascular:  Negative for chest pain.  Gastrointestinal:  Negative for constipation and diarrhea.  Genitourinary:  Negative for difficulty urinating.  Musculoskeletal:  Positive for arthralgias and back pain. Negative for myalgias.  Skin:  Negative for rash.   Allergic/Immunologic: Negative for environmental allergies.  Neurological:  Negative for dizziness, numbness and headaches.  Psychiatric/Behavioral:  The patient is not nervous/anxious.          Past Medical History:  Diagnosis Date   AKI (acute kidney injury) (HCC) 05/28/2019   Arthritis    generalized   Heart murmur    Hypertension    on meds   OSA (obstructive sleep apnea) 05/15/2018   does not use CPAP- has machine   Partial small bowel obstruction (HCC) 05/27/2019   Peyronie's disease    Seasonal allergies     Social History   Socioeconomic History   Marital status: Married    Spouse name: Not on file   Number of children: Not on file   Years of education: Not on file   Highest education level: Not on file  Occupational History   Not on file  Tobacco Use   Smoking status: Former    Current packs/day: 0.00    Average packs/day: 0.3 packs/day for 4.0 years (1.0 ttl pk-yrs)    Types: Cigarettes    Start date: 05/21/1987    Quit date: 05/21/1991    Years since quitting: 32.5   Smokeless tobacco: Never  Vaping Use   Vaping status: Never Used  Substance and Sexual Activity   Alcohol use: No   Drug use: Not Currently    Types: Marijuana   Sexual activity: Yes  Other Topics Concern   Not on file  Social History Narrative   Married.   3 children.  Works as an International aid/development worker.   Enjoys working out, sleeping, relaxing.    Social Drivers of Health   Financial Resource Strain: Patient Declined (12/12/2023)   Overall Financial Resource Strain (CARDIA)    Difficulty of Paying Living Expenses: Patient declined  Food Insecurity: Patient Declined (12/12/2023)   Hunger Vital Sign    Worried About Running Out of Food in the Last Year: Patient declined    Ran Out of Food in the Last Year: Patient declined  Transportation Needs: Patient Declined (12/12/2023)   PRAPARE - Administrator, Civil Service (Medical): Patient declined    Lack of Transportation  (Non-Medical): Patient declined  Physical Activity: Sufficiently Active (12/12/2023)   Exercise Vital Sign    Days of Exercise per Week: 5 days    Minutes of Exercise per Session: 60 min  Stress: No Stress Concern Present (12/12/2023)   [employer] of Occupational Health - Occupational Stress Questionnaire    Feeling of Stress : Not at all  Social Connections: Unknown (12/12/2023)   Social Connection and Isolation Panel [NHANES]    Frequency of Communication with Friends and Family: More than three times a week    Frequency of Social Gatherings with Friends and Family: More than three times a week    Attends Religious Services: Patient declined    Database administrator or Organizations: No    Attends Engineer, structural: Not on file    Marital Status: Married  Catering manager Violence: Not on file    Past Surgical History:  Procedure Laterality Date   MULTIPLE TOOTH EXTRACTIONS     did not receive anethesia- had all teeth removed- has upper and lower dentures    Family History  Problem Relation Age of Onset   Hypertension Mother    Stroke Mother    Heart disease Mother    Hypertension Father    Stroke Father    Diabetes Father    Heart disease Father    Bladder Cancer Father    Diabetes Brother    Heart disease Brother    Lung cancer Paternal Grandmother    Colon polyps Neg Hx    Colon cancer Neg Hx    Esophageal cancer Neg Hx    Stomach cancer Neg Hx    Rectal cancer Neg Hx     No Known Allergies  Current Outpatient Medications on File Prior to Visit  Medication Sig Dispense Refill   lisinopril (ZESTRIL) 20 MG tablet TAKE 1 TABLET BY MOUTH EVERY DAY FOR BLOOD PRESSURE. 90 tablet 3   loratadine (CLARITIN) 10 MG tablet TAKE 1 TABLET BY MOUTH DAILY FOR ALLERGIES 90 tablet 2   rosuvastatin (CRESTOR) 10 MG tablet Take 1 tablet (10 mg total) by mouth daily. 90 tablet 3   No current facility-administered medications on file prior to visit.    BP 132/80    Pulse 88   Temp 98.1 F (36.7 C) (Temporal)   Ht 6' (1.829 m)   Wt 252 lb (114.3 kg)   SpO2 99%   BMI 34.18 kg/m  Objective:   Physical Exam HENT:     Right Ear: Tympanic membrane and ear canal normal.     Left Ear: Tympanic membrane and ear canal normal.  Eyes:     Pupils: Pupils are equal, round, and reactive to light.  Cardiovascular:     Rate and Rhythm: Normal rate and regular rhythm.  Pulmonary:     Effort: Pulmonary effort is normal.  Breath sounds: Normal breath sounds.  Abdominal:     General: Bowel sounds are normal.     Palpations: Abdomen is soft.     Tenderness: There is no abdominal tenderness.  Musculoskeletal:     Cervical back: Neck supple.     Lumbar back: No tenderness or bony tenderness. Decreased range of motion. Negative right straight leg raise test and negative left straight leg raise test.       Back:  Skin:    General: Skin is warm and dry.  Neurological:     Mental Status: He is alert and oriented to person, place, and time.     Cranial Nerves: No cranial nerve deficit.     Deep Tendon Reflexes:     Reflex Scores:      Patellar reflexes are 2+ on the right side and 2+ on the left side. Psychiatric:        Mood and Affect: Mood normal.           Assessment & Plan:  Encounter for annual general medical examination with abnormal findings in adult Assessment & Plan: Declines Shingrix and influenza vaccines Colonoscopy UTD, due 2034 PSA due and pending.  Discussed the importance of a healthy diet and regular exercise in order for weight loss, and to reduce the risk of further co-morbidity.  Exam stable. Labs pending.  Follow up in 1 year for repeat physical.    Essential hypertension -     Comprehensive metabolic panel -     CBC  Prediabetes Assessment & Plan: Repeat A1c pending.  Orders: -     Hemoglobin A1c  Screening for prostate cancer -     PSA  Hyperlipidemia, unspecified hyperlipidemia type Assessment &  Plan: Continue rosuvastatin 10 mg daily. Repeat lipid panel pending.  Orders: -     Lipid panel  Chronic midline low back pain without sciatica Assessment & Plan: Continued, seems to be progressing. No alarm signs on exam or HPI.  Checking plain films of the lumbar spine today. Referral placed to orthopedics for further evaluation.  Start gabapentin 300 mg at bedtime.  Drowsiness precautions provided.  Orders: -     DG Lumbar Spine Complete -     Ambulatory referral to Orthopedic Surgery -     Gabapentin; Take 1 capsule (300 mg total) by mouth at bedtime. For back pain  Dispense: 90 capsule; Refill: 0  OSA (obstructive sleep apnea) Assessment & Plan: Noncompliant to CPAP machine.  Does not wish to pursue.         Doreene Nest, NP

## 2023-12-13 NOTE — Assessment & Plan Note (Signed)
Repeat A1c pending. 

## 2023-12-13 NOTE — Assessment & Plan Note (Signed)
Noncompliant to CPAP machine.  Does not wish to pursue.

## 2023-12-13 NOTE — Patient Instructions (Signed)
Stop by the lab and xray prior to leaving today. I will notify you of your results once received.   Start gabapentin 300 mg at bedtime for pain.  You will either be contacted via phone regarding your referral to orthopedics, or you may receive a letter on your MyChart portal from our referral team with instructions for scheduling an appointment. Please let us know if you have not been contacted by anyone within two weeks.  It was a pleasure to see you today!

## 2023-12-14 ENCOUNTER — Other Ambulatory Visit: Payer: Self-pay | Admitting: Primary Care

## 2023-12-14 DIAGNOSIS — E1165 Type 2 diabetes mellitus with hyperglycemia: Secondary | ICD-10-CM

## 2023-12-14 DIAGNOSIS — J309 Allergic rhinitis, unspecified: Secondary | ICD-10-CM

## 2023-12-14 DIAGNOSIS — I10 Essential (primary) hypertension: Secondary | ICD-10-CM

## 2023-12-14 MED ORDER — LORATADINE 10 MG PO TABS: ORAL_TABLET | ORAL | 3 refills | Status: DC

## 2023-12-16 MED ORDER — OZEMPIC (0.25 OR 0.5 MG/DOSE) 2 MG/3ML ~~LOC~~ SOPN: PEN_INJECTOR | SUBCUTANEOUS | 0 refills | Status: DC

## 2023-12-26 NOTE — Telephone Encounter (Signed)
Did this need authorization?

## 2023-12-30 ENCOUNTER — Other Ambulatory Visit (HOSPITAL_COMMUNITY): Payer: Self-pay

## 2023-12-31 ENCOUNTER — Other Ambulatory Visit (HOSPITAL_COMMUNITY): Payer: Self-pay

## 2023-12-31 ENCOUNTER — Telehealth: Payer: Self-pay

## 2023-12-31 DIAGNOSIS — E1165 Type 2 diabetes mellitus with hyperglycemia: Secondary | ICD-10-CM

## 2023-12-31 NOTE — Telephone Encounter (Signed)
PA request has been Submitted. New Encounter created for follow up. For additional info see Pharmacy Prior Auth telephone encounter from 02/04.

## 2023-12-31 NOTE — Telephone Encounter (Signed)
*  Primary  Pharmacy Patient Advocate Encounter   Received notification from Patient Advice Request messages that prior authorization for Ozempic  (0.25 or 0.5 MG/DOSE) 2MG /3ML pen-injectors  is required/requested.   Insurance verification completed.   The patient is insured through [facility] .   Per test claim: PA required; PA submitted to above mentioned insurance via Prompt PA Key/confirmation #/EOC [id number] Status is pending

## 2024-01-01 DIAGNOSIS — E1165 Type 2 diabetes mellitus with hyperglycemia: Secondary | ICD-10-CM | POA: Insufficient documentation

## 2024-01-01 NOTE — Telephone Encounter (Signed)
 Pharmacy Patient Advocate Encounter  Received notification from [employer] that Prior Authorization for Ozempic  (0.25 or 0.5 MG/DOSE) 2MG /3ML pen-injectors  has been DENIED.  Full denial letter will be uploaded to the media tab. See denial reason below.   PA #/Case ID/Reference #: [id number]

## 2024-01-01 NOTE — Telephone Encounter (Signed)
 Hello! He has a diagnosis of type 2 diabetes with A1C of 6.7. can you please re-run the PA. Thanks.

## 2024-01-02 ENCOUNTER — Telehealth: Payer: Self-pay

## 2024-01-02 ENCOUNTER — Other Ambulatory Visit (HOSPITAL_COMMUNITY): Payer: Self-pay

## 2024-01-02 NOTE — Telephone Encounter (Signed)
 PA request has been  RESUBMITTED . New Encounter created for follow up. For additional info see Pharmacy Prior Auth telephone encounter from 01/02/24.

## 2024-01-02 NOTE — Telephone Encounter (Signed)
 Pharmacy Patient Advocate Encounter   Received notification from Pt Calls Messages that prior authorization for OZEMPIC  is required/requested.   Insurance verification completed.   The patient is insured through [facility] .   Per test claim: PA required; PA submitted to above mentioned insurance via Prompt PA Key/confirmation #/[id number] Status is pending

## 2024-01-06 ENCOUNTER — Encounter: Payer: Self-pay | Admitting: *Deleted

## 2024-01-06 NOTE — Telephone Encounter (Signed)
 Thank you! His problem list already states a diagnosis of type 2 diabetes with hyperglycemia. What else do they need? I'm not sure.

## 2024-01-06 NOTE — Telephone Encounter (Signed)
 Prior Authorization form/request asks a question that requires your assistance. Please see the question below and advise accordingly. The PA will not be submitted until the necessary information is received.      Lab results were submitted with PA but it is kicking back because the diagnosis say Prediabetes and not type 2 diabetes. Insurance requires chart notes that states patient has a diagnosis of type 2 diabetes.

## 2024-01-07 NOTE — Telephone Encounter (Signed)
Do you think they will accept my comments from the labs dated 12/13/23? You can see where I discuss his new diagnosis of diabetes. I already added the type 2 diabetes to his problem list.

## 2024-01-07 NOTE — Telephone Encounter (Signed)
When submitting the PA, we submitted the last chart notes and lab results. The chart notes and lab results both say Prediabetes and not type 2 diabetes. I think that may be the problem. Even though the lab results are clearly diabetes, insurance is still requiring documentation saying type 2 diabetes. If you could addend the last chart notes and lab results to show the diagnosis of diabetes, hopefully that will work.

## 2024-01-08 ENCOUNTER — Telehealth: Payer: Self-pay | Admitting: Pharmacist

## 2024-01-08 NOTE — Telephone Encounter (Signed)
Appeal has been submitted for Ozempic. Will advise when response is received, please be advised that most companies may take 30 days to make a decision. Appeal letter along with supporting documentation have been faxed to [phone] on 01/08/2024 @9 :30 am.  Thank you, Dellie Burns, PharmD Clinical Pharmacist  [facility]  Direct Dial: [phone]

## 2024-01-08 NOTE — Telephone Encounter (Signed)
I used the labs from 12/13/23 and the insurance still requested additional documentation. I have looped our clinical pharmacist in to assist.

## 2024-01-08 NOTE — Telephone Encounter (Signed)
Great, thanks!

## 2024-01-08 NOTE — Telephone Encounter (Signed)
Called patient and reviewed all information. Patient verbalized understanding. Will call if any further questions.

## 2024-01-08 NOTE — Telephone Encounter (Signed)
NotedHarvin Mccarty, will you call the patient and let him know that we are appealing the Ozempic prescription denial with his insurance? Sometimes it can take up to 30 days, but hopefully not.  Have him reach out to his insurance company in 2 weeks if he hasn't heard anything.

## 2024-01-13 ENCOUNTER — Other Ambulatory Visit (HOSPITAL_COMMUNITY): Payer: Self-pay

## 2024-01-13 NOTE — Telephone Encounter (Signed)
Pharmacy Patient Advocate Encounter  Received notification from  Rx Benefits  that Appeal for Ozempic has been APPROVED from 01/08/2024 to 01/06/2025. Ran test claim, Copay is $40. This test claim was processed through [facility]- copay amounts may vary at other pharmacies due to pharmacy/plan contracts, or as the patient moves through the different stages of their insurance plan. APPEAL APPROVAL LETTER IS INDEXED IN PT MEDIA   PA #/Case ID/Reference #: [id number]

## 2024-01-24 DIAGNOSIS — M5451 Vertebrogenic low back pain: Secondary | ICD-10-CM | POA: Diagnosis not present

## 2024-02-11 ENCOUNTER — Encounter: Payer: Self-pay | Admitting: Gastroenterology

## 2024-04-08 ENCOUNTER — Other Ambulatory Visit: Payer: Self-pay | Admitting: Primary Care

## 2024-04-08 DIAGNOSIS — E1165 Type 2 diabetes mellitus with hyperglycemia: Secondary | ICD-10-CM

## 2024-04-08 NOTE — Telephone Encounter (Signed)
Patient is due for diabetes follow up, this will be required prior to any further refills.  Please schedule, thank you!   

## 2024-04-09 ENCOUNTER — Encounter: Payer: Self-pay | Admitting: Primary Care

## 2024-04-09 ENCOUNTER — Ambulatory Visit: Admitting: Primary Care

## 2024-04-09 VITALS — BP 148/90 | HR 96 | Temp 97.7°F | Ht 72.0 in | Wt 233.0 lb

## 2024-04-09 DIAGNOSIS — E1165 Type 2 diabetes mellitus with hyperglycemia: Secondary | ICD-10-CM | POA: Diagnosis not present

## 2024-04-09 DIAGNOSIS — Z7985 Long-term (current) use of injectable non-insulin antidiabetic drugs: Secondary | ICD-10-CM

## 2024-04-09 LAB — POCT GLYCOSYLATED HEMOGLOBIN (HGB A1C): Hemoglobin A1C: 5.4 % (ref 4.0–5.6)

## 2024-04-09 LAB — MICROALBUMIN / CREATININE URINE RATIO
Creatinine,U: 207.9 mg/dL
Microalb Creat Ratio: 4.7 mg/g (ref 0.0–30.0)
Microalb, Ur: 1 mg/dL (ref 0.0–1.9)

## 2024-04-09 NOTE — Telephone Encounter (Signed)
 Called pt. No answer and Unable to leave voicemail.

## 2024-04-09 NOTE — Progress Notes (Signed)
 Subjective:    Patient ID: Edward Mccarty, male    DOB: [birth year], 53 y.o.   MRN: [id number]  HPI  Edward Mccarty is a very pleasant 53 y.o. male with a history of new onset type 2 diabetes, OSA, hypertension, hyperlipidemia who presents today for follow-up of diabetes.  Current medications include: Ozempic  0.5 mg weekly. He is tolerating Ozempic  well overall.   He is checking his blood glucose on occasion times daily and is getting readings ranging: low to mid 100s  Last A1C: 6.7 in January 2025, 5.4 today.  Last Eye Exam: UTD Last Foot Exam: Due Pneumonia Vaccination: never completed Urine Microalbumin: Due Statin: rosuvastatin    Dietary changes since last visit: Smaller portion sizes, trying to eat healthier, does continue to drink sweet tea and cherry Pepsi.  He eats dinner out every night including fast food. Trying to make better choices.    Exercise: Active   Wt Readings from Last 3 Encounters:  04/09/24 233 lb (105.7 kg)  12/13/23 252 lb (114.3 kg)  07/23/23 246 lb (111.6 kg)   BP Readings from Last 3 Encounters:  04/09/24 (!) 148/90  12/13/23 132/80  08/01/23 111/73     Review of Systems  Eyes:  Negative for visual disturbance.  Cardiovascular:  Negative for chest pain.  Endocrine: Negative for polydipsia, polyphagia and polyuria.  Neurological:  Negative for numbness.         Past Medical History:  Diagnosis Date   AKI (acute kidney injury) (HCC) 05/28/2019   Arthritis    generalized   Heart murmur    Hypertension    on meds   OSA (obstructive sleep apnea) 05/15/2018   does not use CPAP- has machine   Partial small bowel obstruction (HCC) 05/27/2019   Peyronie's disease    Seasonal allergies     Social History   Socioeconomic History   Marital status: Married    Spouse name: Not on file   Number of children: Not on file   Years of education: Not on file   Highest education level: Not on file  Occupational History   Not on file   Tobacco Use   Smoking status: Former    Current packs/day: 0.00    Average packs/day: 0.3 packs/day for 4.0 years (1.0 ttl pk-yrs)    Types: Cigarettes    Start date: 05/21/1987    Quit date: 05/21/1991    Years since quitting: 32.9   Smokeless tobacco: Never  Vaping Use   Vaping status: Never Used  Substance and Sexual Activity   Alcohol use: No   Drug use: Not Currently    Types: Marijuana   Sexual activity: Yes  Other Topics Concern   Not on file  Social History Narrative   Married.   3 children.   Works as an International aid/development worker.   Enjoys working out, sleeping, relaxing.    Social Drivers of Health   Financial Resource Strain: Patient Declined (12/12/2023)   Overall Financial Resource Strain (CARDIA)    Difficulty of Paying Living Expenses: Patient declined  Food Insecurity: Patient Declined (12/12/2023)   Hunger Vital Sign    Worried About Running Out of Food in the Last Year: Patient declined    Ran Out of Food in the Last Year: Patient declined  Transportation Needs: Patient Declined (12/12/2023)   PRAPARE - Administrator, Civil Service (Medical): Patient declined    Lack of Transportation (Non-Medical): Patient declined  Physical Activity: Sufficiently Active (12/12/2023)  Exercise Vital Sign    Days of Exercise per Week: 5 days    Minutes of Exercise per Session: 60 min  Stress: No Stress Concern Present (12/12/2023)   [employer] of Occupational Health - Occupational Stress Questionnaire    Feeling of Stress : Not at all  Social Connections: Unknown (12/12/2023)   Social Connection and Isolation Panel [NHANES]    Frequency of Communication with Friends and Family: More than three times a week    Frequency of Social Gatherings with Friends and Family: More than three times a week    Attends Religious Services: Patient declined    Database administrator or Organizations: No    Attends Engineer, structural: Not on file    Marital Status:  Married  Catering manager Violence: Not on file    Past Surgical History:  Procedure Laterality Date   MULTIPLE TOOTH EXTRACTIONS     did not receive anethesia- had all teeth removed- has upper and lower dentures    Family History  Problem Relation Age of Onset   Hypertension Mother    Stroke Mother    Heart disease Mother    Hypertension Father    Stroke Father    Diabetes Father    Heart disease Father    Bladder Cancer Father    Diabetes Brother    Heart disease Brother    Lung cancer Paternal Grandmother    Colon polyps Neg Hx    Colon cancer Neg Hx    Esophageal cancer Neg Hx    Stomach cancer Neg Hx    Rectal cancer Neg Hx     No Known Allergies  Current Outpatient Medications on File Prior to Visit  Medication Sig Dispense Refill   gabapentin  (NEURONTIN ) 300 MG capsule Take 1 capsule (300 mg total) by mouth at bedtime. For back pain 90 capsule 0   lisinopril  (ZESTRIL ) 20 MG tablet TAKE 1 TABLET BY MOUTH EVERY DAY FOR BLOOD PRESSURE 90 tablet 3   loratadine  (CLARITIN ) 10 MG tablet TAKE 1 TABLET BY MOUTH DAILY FOR ALLERGIES 90 tablet 3   rosuvastatin  (CRESTOR ) 10 MG tablet Take 1 tablet (10 mg total) by mouth daily. 90 tablet 3   Semaglutide ,0.25 or 0.5MG /DOS, (OZEMPIC , 0.25 OR 0.5 MG/DOSE,) 2 MG/3ML SOPN Inject 0.25 mg into the skin once weekly for 4 weeks, then increase to 0.5 mg once weekly thereafter for diabetes. 9 mL 0   No current facility-administered medications on file prior to visit.    BP (!) 148/90   Pulse 96   Temp 97.7 F (36.5 C) (Temporal)   Ht 6' (1.829 m)   Wt 233 lb (105.7 kg)   SpO2 98%   BMI 31.60 kg/m  Objective:   Physical Exam Cardiovascular:     Rate and Rhythm: Normal rate and regular rhythm.  Pulmonary:     Effort: Pulmonary effort is normal.     Breath sounds: Normal breath sounds.  Musculoskeletal:     Cervical back: Neck supple.  Skin:    General: Skin is warm and dry.  Neurological:     Mental Status: He is alert and  oriented to person, place, and time.  Psychiatric:        Mood and Affect: Mood normal.           Assessment & Plan:  Type 2 diabetes mellitus with hyperglycemia, without long-term current use of insulin (HCC) Assessment & Plan: Improved and controlled with A1C today of 5.4!!!  Commended  patient on weight loss, dietary changes. Encouraged to continue.  Continue Ozempic  0.5 mg weekly.  Foot exam today. Urine microalbumin due and pending.  Follow up in 6 months.  Orders: -     POCT glycosylated hemoglobin (Hb A1C) -     Microalbumin / creatinine urine ratio        Gabriel John, NP

## 2024-04-09 NOTE — Assessment & Plan Note (Signed)
 Improved and controlled with A1C today of 5.4!!!  Commended patient on weight loss, dietary changes. Encouraged to continue.  Continue Ozempic  0.5 mg weekly.  Foot exam today. Urine microalbumin due and pending.  Follow up in 6 months.

## 2024-04-09 NOTE — Patient Instructions (Signed)
 Continue Ozempic  0.5 mg weekly for diabetes.  Stop by the lab prior to leaving today. I will notify you of your results once received.   Please schedule a follow up visit for 6 months for a diabetes check.  It was a pleasure to see you today!

## 2024-04-14 ENCOUNTER — Ambulatory Visit: Payer: Self-pay | Admitting: Primary Care

## 2024-04-14 DIAGNOSIS — E1165 Type 2 diabetes mellitus with hyperglycemia: Secondary | ICD-10-CM

## 2024-04-15 MED ORDER — OZEMPIC (0.25 OR 0.5 MG/DOSE) 2 MG/3ML ~~LOC~~ SOPN: 0.5000 mg | PEN_INJECTOR | SUBCUTANEOUS | 1 refills | Status: DC

## 2024-06-09 DIAGNOSIS — G8929 Other chronic pain: Secondary | ICD-10-CM

## 2024-06-09 MED ORDER — GABAPENTIN 300 MG PO CAPS: ORAL_CAPSULE | ORAL | 0 refills | Status: DC

## 2024-08-29 ENCOUNTER — Other Ambulatory Visit: Payer: Self-pay | Admitting: Primary Care

## 2024-08-29 DIAGNOSIS — G8929 Other chronic pain: Secondary | ICD-10-CM

## 2024-09-24 ENCOUNTER — Other Ambulatory Visit: Payer: Self-pay | Admitting: Primary Care

## 2024-09-24 DIAGNOSIS — E1165 Type 2 diabetes mellitus with hyperglycemia: Secondary | ICD-10-CM

## 2024-11-09 ENCOUNTER — Other Ambulatory Visit: Payer: Self-pay | Admitting: Primary Care

## 2024-11-09 DIAGNOSIS — J309 Allergic rhinitis, unspecified: Secondary | ICD-10-CM

## 2024-12-04 ENCOUNTER — Other Ambulatory Visit: Payer: Self-pay | Admitting: Primary Care

## 2024-12-04 DIAGNOSIS — G8929 Other chronic pain: Secondary | ICD-10-CM

## 2024-12-12 ENCOUNTER — Other Ambulatory Visit: Payer: Self-pay | Admitting: Primary Care

## 2024-12-12 DIAGNOSIS — I10 Essential (primary) hypertension: Secondary | ICD-10-CM

## 2024-12-18 ENCOUNTER — Ambulatory Visit: Payer: Self-pay | Admitting: Primary Care

## 2024-12-18 ENCOUNTER — Encounter: Payer: Self-pay | Admitting: Primary Care

## 2024-12-18 VITALS — BP 118/74 | HR 89 | Temp 98.1°F | Ht 72.5 in | Wt 225.5 lb

## 2024-12-18 DIAGNOSIS — Z0000 Encounter for general adult medical examination without abnormal findings: Secondary | ICD-10-CM

## 2024-12-18 DIAGNOSIS — Z125 Encounter for screening for malignant neoplasm of prostate: Secondary | ICD-10-CM

## 2024-12-18 DIAGNOSIS — E1165 Type 2 diabetes mellitus with hyperglycemia: Secondary | ICD-10-CM

## 2024-12-18 DIAGNOSIS — G4733 Obstructive sleep apnea (adult) (pediatric): Secondary | ICD-10-CM | POA: Diagnosis not present

## 2024-12-18 DIAGNOSIS — G8929 Other chronic pain: Secondary | ICD-10-CM

## 2024-12-18 DIAGNOSIS — Z7985 Long-term (current) use of injectable non-insulin antidiabetic drugs: Secondary | ICD-10-CM

## 2024-12-18 DIAGNOSIS — E785 Hyperlipidemia, unspecified: Secondary | ICD-10-CM

## 2024-12-18 DIAGNOSIS — M545 Low back pain, unspecified: Secondary | ICD-10-CM

## 2024-12-18 DIAGNOSIS — I10 Essential (primary) hypertension: Secondary | ICD-10-CM

## 2024-12-18 LAB — COMPREHENSIVE METABOLIC PANEL WITH GFR
ALT: 15 U/L (ref 3–53)
AST: 14 U/L (ref 5–37)
Albumin: 4.3 g/dL (ref 3.5–5.2)
Alkaline Phosphatase: 61 U/L (ref 39–117)
BUN: 16 mg/dL (ref 6–23)
CO2: 32 meq/L (ref 19–32)
Calcium: 8.9 mg/dL (ref 8.4–10.5)
Chloride: 103 meq/L (ref 96–112)
Creatinine, Ser: 1.32 mg/dL (ref 0.40–1.50)
GFR: 62.09 mL/min
Glucose, Bld: 103 mg/dL — ABNORMAL HIGH (ref 70–99)
Potassium: 4.5 meq/L (ref 3.5–5.1)
Sodium: 140 meq/L (ref 135–145)
Total Bilirubin: 1.1 mg/dL (ref 0.2–1.2)
Total Protein: 6.9 g/dL (ref 6.0–8.3)

## 2024-12-18 LAB — LIPID PANEL
Cholesterol: 178 mg/dL (ref 28–200)
HDL: 34 mg/dL — ABNORMAL LOW
LDL Cholesterol: 114 mg/dL — ABNORMAL HIGH (ref 10–99)
NonHDL: 144.32
Total CHOL/HDL Ratio: 5
Triglycerides: 150 mg/dL — ABNORMAL HIGH (ref 10.0–149.0)
VLDL: 30 mg/dL (ref 0.0–40.0)

## 2024-12-18 LAB — PSA: PSA: 1.36 ng/mL (ref 0.10–4.00)

## 2024-12-18 NOTE — Patient Instructions (Signed)
 Stop by the lab prior to leaving today. I will notify you of your results once received.   Please schedule a follow up visit for 6 months for a diabetes check.  It was a pleasure to see you today!

## 2024-12-18 NOTE — Progress Notes (Signed)
 "  Subjective:    Patient ID: Edward Mccarty, male    DOB: [birth year], 53 y.o.   MRN: [id number]  Edward Mccarty is a very pleasant 54 y.o. male who presents today for complete physical and follow up of chronic conditions.  Immunizations: -Tetanus: Completed in 2018 -Influenza: Declines influenza vaccine.  -Shingles: Never completed Shingrix series   Diet: Fair diet.  Exercise: No regular exercise.  Eye exam: Completed > 1 year ago  Dental exam: Completed years ago.   Colonoscopy: Completed in 2024, due 2034   PSA: Due  BP Readings from Last 3 Encounters:  12/18/24 118/74  04/09/24 (!) 148/90  12/13/23 132/80         Review of Systems  Constitutional:  Negative for unexpected weight change.  HENT:  Negative for rhinorrhea.   Respiratory:  Negative for cough and shortness of breath.   Cardiovascular:  Negative for chest pain.  Gastrointestinal:  Negative for constipation and diarrhea.  Genitourinary:  Negative for difficulty urinating.  Musculoskeletal:  Negative for arthralgias and myalgias.  Skin:  Negative for rash.  Allergic/Immunologic: Negative for environmental allergies.  Neurological:  Negative for dizziness and headaches.  Psychiatric/Behavioral:  The patient is not nervous/anxious.          Past Medical History:  Diagnosis Date   AKI (acute kidney injury) 05/28/2019   Arthritis    generalized   Heart murmur    Hypertension    on meds   OSA (obstructive sleep apnea) 05/15/2018   does not use CPAP- has machine   Oxygen deficiency    Partial small bowel obstruction (HCC) 05/27/2019   Peyronie's disease    Seasonal allergies    Sleep apnea     Social History   Socioeconomic History   Marital status: Married    Spouse name: Not on file   Number of children: Not on file   Years of education: Not on file   Highest education level: Not on file  Occupational History   Not on file  Tobacco Use   Smoking status: Former    Current  packs/day: 0.00    Average packs/day: 0.3 packs/day for 4.0 years (1.0 ttl pk-yrs)    Types: Cigarettes    Start date: 05/21/1987    Quit date: 05/21/1991    Years since quitting: 33.6   Smokeless tobacco: Never  Vaping Use   Vaping status: Never Used  Substance and Sexual Activity   Alcohol use: No   Drug use: Not Currently    Types: Marijuana   Sexual activity: Yes  Other Topics Concern   Not on file  Social History Narrative   Married.   3 children.   Works as an international aid/development worker.   Enjoys working out, sleeping, relaxing.    Social Drivers of Health   Tobacco Use: Medium Risk (12/18/2024)   Patient History    Smoking Tobacco Use: Former    Smokeless Tobacco Use: Never    Passive Exposure: Not on file  Financial Resource Strain: Patient Declined (12/12/2023)   Overall Financial Resource Strain (CARDIA)    Difficulty of Paying Living Expenses: Patient declined  Food Insecurity: Patient Declined (12/12/2023)   Hunger Vital Sign    Worried About Running Out of Food in the Last Year: Patient declined    Ran Out of Food in the Last Year: Patient declined  Transportation Needs: Patient Declined (12/12/2023)   PRAPARE - Administrator, Civil Service (Medical): Patient declined  Lack of Transportation (Non-Medical): Patient declined  Physical Activity: Sufficiently Active (12/12/2023)   Exercise Vital Sign    Days of Exercise per Week: 5 days    Minutes of Exercise per Session: 60 min  Stress: No Stress Concern Present (12/12/2023)   [employer] of Occupational Health - Occupational Stress Questionnaire    Feeling of Stress : Not at all  Social Connections: Unknown (12/12/2023)   Social Connection and Isolation Panel    Frequency of Communication with Friends and Family: More than three times a week    Frequency of Social Gatherings with Friends and Family: More than three times a week    Attends Religious Services: Patient declined    Active Member of Clubs  or Organizations: No    Attends Engineer, Structural: Not on file    Marital Status: Married  Catering Manager Violence: Not on file  Depression (PHQ2-9): Low Risk (12/18/2024)   Depression (PHQ2-9)    PHQ-2 Score: 2  Alcohol Screen: Low Risk (12/12/2023)   Alcohol Screen    Last Alcohol Screening Score (AUDIT): 1  Housing: Patient Declined (12/12/2023)   Housing Stability Vital Sign    Unable to Pay for Housing in the Last Year: Patient declined    Number of Times Moved in the Last Year: Not on file    Homeless in the Last Year: Patient declined  Utilities: Not on file  Health Literacy: Not on file    Past Surgical History:  Procedure Laterality Date   MULTIPLE TOOTH EXTRACTIONS     did not receive anethesia- had all teeth removed- has upper and lower dentures    Family History  Problem Relation Age of Onset   Hypertension Mother    Stroke Mother    Heart disease Mother    Hypertension Father    Stroke Father    Diabetes Father    Heart disease Father    Bladder Cancer Father    Diabetes Brother    Heart disease Brother    Lung cancer Paternal Grandmother    Colon polyps Neg Hx    Colon cancer Neg Hx    Esophageal cancer Neg Hx    Stomach cancer Neg Hx    Rectal cancer Neg Hx     Allergies[1]  Medications Ordered Prior to Encounter[2]  BP 118/74   Pulse 89   Temp 98.1 F (36.7 C) (Oral)   Ht 6' 0.5 (1.842 m)   Wt 225 lb 8 oz (102.3 kg)   SpO2 99%   BMI 30.16 kg/m  Objective:   Physical Exam HENT:     Right Ear: Tympanic membrane and ear canal normal.     Left Ear: Tympanic membrane and ear canal normal.  Eyes:     Pupils: Pupils are equal, round, and reactive to light.  Cardiovascular:     Rate and Rhythm: Normal rate and regular rhythm.  Pulmonary:     Effort: Pulmonary effort is normal.     Breath sounds: Normal breath sounds.  Abdominal:     General: Bowel sounds are normal.     Palpations: Abdomen is soft.     Tenderness: There is  no abdominal tenderness.  Musculoskeletal:        General: Normal range of motion.     Cervical back: Neck supple.  Skin:    General: Skin is warm and dry.  Neurological:     Mental Status: He is alert and oriented to person, place, and time.  Cranial Nerves: No cranial nerve deficit.     Deep Tendon Reflexes:     Reflex Scores:      Patellar reflexes are 2+ on the right side and 2+ on the left side. Psychiatric:        Mood and Affect: Mood normal.     Physical Exam        Assessment & Plan:  Preventative health care Assessment & Plan: Declines Shingrix and influenza vaccines. Colonoscopy UTD, due 2034 PSA due and pending.  Discussed the importance of a healthy diet and regular exercise in order for weight loss, and to reduce the risk of further co-morbidity.  Exam stable. Labs pending.  Follow up in 1 year for repeat physical.    Essential hypertension Assessment & Plan: Controlled.  Continue lisinopril  20 mg daily. CMP pending.   OSA (obstructive sleep apnea) Assessment & Plan: Inconsistent use of CPAP, discussed importance of nightly use.    Type 2 diabetes mellitus with hyperglycemia, without long-term current use of insulin (HCC) Assessment & Plan: Repeat A1C pending.  Work on a healthy diet and regular exercise in order for weight loss, and to reduce the risk of further co-morbidity. Continue Ozempic  0.5 mg weekly.   Follow up in 6 months.   Hyperlipidemia, unspecified hyperlipidemia type Assessment & Plan: Repeat lipid panel pending. Continue rosuvastatin  10 mg daily  Work on a healthy diet and regular exercise in order for weight loss, and to reduce the risk of further co-morbidity.   Orders: -     Lipid panel -     Comprehensive metabolic panel with GFR  Chronic midline low back pain without sciatica Assessment & Plan: Stable.  Continue gabapentin  900 mg HS.   Screening for prostate cancer -     PSA    Assessment and  Plan Assessment & Plan         Comer MARLA Gaskins, NP       [1] No Known Allergies [2]  Current Outpatient Medications on File Prior to Visit  Medication Sig Dispense Refill   gabapentin  (NEURONTIN ) 300 MG capsule TAKE 1 CAPSULE BY MOUTH DURING THE DAY AND 2 CAPSULES BY MOUTH AT BEDTIME FOR BACK PAIN 270 capsule 0   lisinopril  (ZESTRIL ) 20 MG tablet TAKE 1 TABLET BY MOUTH EVERY DAY FOR BLOOD PRESSURE 90 tablet 0   loratadine  (CLARITIN ) 10 MG tablet TAKE 1 TABLET BY MOUTH DAILY FOR ALLERGIES 90 tablet 0   rosuvastatin  (CRESTOR ) 10 MG tablet Take 1 tablet (10 mg total) by mouth daily. 90 tablet 3   Semaglutide ,0.25 or 0.5MG /DOS, (OZEMPIC , 0.25 OR 0.5 MG/DOSE,) 2 MG/3ML SOPN INJECT 0.5 MG INTO THE SKIN ONCE A WEEK. FOR DIABETES. 9 mL 0   No current facility-administered medications on file prior to visit.   "

## 2024-12-18 NOTE — Assessment & Plan Note (Signed)
 Declines Shingrix and influenza vaccines Colonoscopy UTD, due 2034 PSA due and pending.  Discussed the importance of a healthy diet and regular exercise in order for weight loss, and to reduce the risk of further co-morbidity.  Exam stable. Labs pending.  Follow up in 1 year for repeat physical.

## 2024-12-18 NOTE — Assessment & Plan Note (Addendum)
 Repeat lipid panel pending Continue rosuvastatin  10 mg daily.  Work on a healthy diet and regular exercise in order for weight loss, and to reduce the risk of further co-morbidity.

## 2024-12-18 NOTE — Assessment & Plan Note (Signed)
 Stable.  Continue gabapentin  900 mg HS.

## 2024-12-18 NOTE — Assessment & Plan Note (Signed)
 Controlled.  Continue lisinopril 20 mg daily.  CMP pending.

## 2024-12-18 NOTE — Assessment & Plan Note (Addendum)
 Repeat A1C pending.  Work on a healthy diet and regular exercise in order for weight loss, and to reduce the risk of further co-morbidity. Continue Ozempic  0.5 mg weekly.   Follow up in 6 months.

## 2024-12-18 NOTE — Assessment & Plan Note (Addendum)
 Inconsistent use of CPAP, discussed importance of nightly use.

## 2024-12-26 ENCOUNTER — Other Ambulatory Visit: Payer: Self-pay | Admitting: Primary Care

## 2024-12-26 DIAGNOSIS — E1165 Type 2 diabetes mellitus with hyperglycemia: Secondary | ICD-10-CM

## 2024-12-30 ENCOUNTER — Ambulatory Visit: Payer: Self-pay | Admitting: Primary Care

## 2024-12-30 ENCOUNTER — Other Ambulatory Visit

## 2024-12-30 DIAGNOSIS — E1165 Type 2 diabetes mellitus with hyperglycemia: Secondary | ICD-10-CM | POA: Diagnosis not present

## 2024-12-30 LAB — POCT GLYCOSYLATED HEMOGLOBIN (HGB A1C): Hemoglobin A1C: 5.4 % (ref 4.0–5.6)

## 2024-12-30 MED ORDER — ROSUVASTATIN CALCIUM 10 MG PO TABS: 10.0000 mg | ORAL_TABLET | Freq: Every day | ORAL | 2 refills | Status: AC

## 2025-06-17 ENCOUNTER — Ambulatory Visit: Admitting: Primary Care
